# Patient Record
Sex: Female | Born: 1955 | Race: White | Hispanic: No | Smoking: Former smoker
Health system: Southern US, Community
[De-identification: ages and names within clinical notes are randomized; demographics above are authoritative.]

## PROBLEM LIST (undated history)

## (undated) DIAGNOSIS — E785 Hyperlipidemia, unspecified: Secondary | ICD-10-CM

## (undated) DIAGNOSIS — K219 Gastro-esophageal reflux disease without esophagitis: Secondary | ICD-10-CM

## (undated) HISTORY — DX: Gastro-esophageal reflux disease without esophagitis: K21.9

## (undated) HISTORY — PX: PELVIC LAPAROSCOPY: SHX162

## (undated) HISTORY — PX: LASER LAPAROSCOPY: SHX1952

## (undated) HISTORY — PX: TONSILLECTOMY: SUR1361

## (undated) HISTORY — DX: Hyperlipidemia, unspecified: E78.5

---

## 2006-05-22 HISTORY — PX: SKIN CANCER DESTRUCTION: SHX778

## 2010-05-22 HISTORY — PX: COLONOSCOPY: SHX174

## 2015-04-16 LAB — HM MAMMOGRAPHY

## 2015-05-03 LAB — HM PAP SMEAR
HM PAP: NEGATIVE
HM PAP: NEGATIVE

## 2016-06-20 ENCOUNTER — Encounter: Payer: Self-pay | Admitting: Osteopathic Medicine

## 2016-06-20 ENCOUNTER — Ambulatory Visit (INDEPENDENT_AMBULATORY_CARE_PROVIDER_SITE_OTHER): Payer: PRIVATE HEALTH INSURANCE | Admitting: Osteopathic Medicine

## 2016-06-20 VITALS — BP 138/84 | HR 91 | Ht 60.0 in | Wt 166.0 lb

## 2016-06-20 DIAGNOSIS — K219 Gastro-esophageal reflux disease without esophagitis: Secondary | ICD-10-CM | POA: Insufficient documentation

## 2016-06-20 DIAGNOSIS — K580 Irritable bowel syndrome with diarrhea: Secondary | ICD-10-CM | POA: Diagnosis not present

## 2016-06-20 DIAGNOSIS — E785 Hyperlipidemia, unspecified: Secondary | ICD-10-CM | POA: Insufficient documentation

## 2016-06-20 LAB — COMPLETE METABOLIC PANEL WITH GFR
ALBUMIN: 3.8 g/dL (ref 3.6–5.1)
ALK PHOS: 91 U/L (ref 33–130)
ALT: 17 U/L (ref 6–29)
AST: 17 U/L (ref 10–35)
BILIRUBIN TOTAL: 0.3 mg/dL (ref 0.2–1.2)
BUN: 12 mg/dL (ref 7–25)
CO2: 25 mmol/L (ref 20–31)
CREATININE: 0.82 mg/dL (ref 0.50–1.05)
Calcium: 9.6 mg/dL (ref 8.6–10.4)
Chloride: 105 mmol/L (ref 98–110)
GFR, EST NON AFRICAN AMERICAN: 79 mL/min (ref >=60)
GFR, Est African American: >89 mL/min (ref >=60)
GLUCOSE: 96 mg/dL (ref 65–99)
Potassium: 4.6 mmol/L (ref 3.5–5.3)
SODIUM: 141 mmol/L (ref 135–146)
TOTAL PROTEIN: 6.5 g/dL (ref 6.1–8.1)

## 2016-06-20 LAB — CBC WITH DIFFERENTIAL/PLATELET
BASOS ABS: 0 {cells}/uL (ref 0–200)
BASOS PCT: 0 %
EOS PCT: 3 %
Eosinophils Absolute: 300 cells/uL (ref 15–500)
HCT: 38.6 % (ref 35.0–45.0)
HEMOGLOBIN: 12.9 g/dL (ref 11.7–15.5)
LYMPHS ABS: 3000 {cells}/uL (ref 850–3900)
Lymphocytes Relative: 30 %
MCH: 31.9 pg (ref 27.0–33.0)
MCHC: 33.4 g/dL (ref 32.0–36.0)
MCV: 95.3 fL (ref 80.0–100.0)
MPV: 10.1 fL (ref 7.5–12.5)
Monocytes Absolute: 600 cells/uL (ref 200–950)
Monocytes Relative: 6 %
NEUTROS ABS: 6100 {cells}/uL (ref 1500–7800)
Neutrophils Relative %: 61 %
Platelets: 427 10*3/uL — ABNORMAL HIGH (ref 140–400)
RBC: 4.05 MIL/uL (ref 3.80–5.10)
RDW: 13.1 % (ref 11.0–15.0)
WBC: 10 10*3/uL (ref 3.8–10.8)

## 2016-06-20 LAB — LIPID PANEL
Cholesterol: 179 mg/dL (ref <200)
HDL: 50 mg/dL — ABNORMAL LOW (ref >50)
LDL CALC: 90 mg/dL (ref <100)
Total CHOL/HDL Ratio: 3.6 Ratio (ref <5.0)
Triglycerides: 196 mg/dL — ABNORMAL HIGH (ref <150)
VLDL: 39 mg/dL — ABNORMAL HIGH (ref <30)

## 2016-06-20 LAB — TSH: TSH: 2.19 mIU/L

## 2016-06-20 MED ORDER — LOPERAMIDE HCL 2 MG PO TABS: 1.0000 mg | ORAL_TABLET | Freq: Three times a day (TID) | ORAL | 1 refills | Status: DC

## 2016-06-20 MED ORDER — ONDANSETRON HCL 8 MG PO TABS: 4.0000 mg | ORAL_TABLET | Freq: Three times a day (TID) | ORAL | 1 refills | Status: DC

## 2016-06-20 NOTE — Patient Instructions (Addendum)
Call if no better after 1-2 weeks w/ Rx and dietary modifications.  Seek medical care if severe pain, fever, bloody stool, or other concerns See below for Low FODMAP diet      2017 UpToDate Characteristics and sources of common FODMAPs  Word that corresponds to letter in acronym Compounds in this category Foods that contain these compounds  F Fermentable  O Oligosaccharides Fructans, galacto-oligosaccharides Wheat, barley, rye, onion, leek, white part of spring onion, garlic, shallots, artichokes, beetroot, fennel, peas, chicory, pistachio, cashews, legumes, lentils, and chickpeas  D Disaccharides Lactose Milk, custard, ice cream, and yogurt  M Monosaccharides "Free fructose" (fructose in excess of glucose) Apples, pears, mangoes, cherries, watermelon, asparagus, sugar snap peas, honey, high-fructose corn syrup  A And  P Polyols Sorbitol, mannitol, maltitol, and xylitol Apples, pears, apricots, cherries, nectarines, peaches, plums, watermelon, mushrooms, cauliflower, artificially sweetened chewing gum and confectionery  FODMAPs: fermentable oligosaccharides, disaccharides, monosaccharides, and polyols. Adapted by permission from [employer]: Limited Brands[employer]an Journal of Gastroenterology. Lonell FaceShepherd SJ, Lomer MC, [employer] [place]. Short-chain carbohydrates and functional gastrointestinal disorders. Am J Gastroenterol 2013; 108:707. Copyright  2013. www.nature.com/ajg. Graphic 6213090186 Version 2.0    Irritable Bowel Syndrome, Adult Irritable bowel syndrome (IBS) is not one specific disease. It is a group of symptoms that affects the organs responsible for digestion (gastrointestinal or GI tract). To regulate how your GI tract works, your body sends signals back and forth between your intestines and your brain. If you have IBS, there may be a problem with these signals. As a result, your GI tract does not function normally. Your intestines may become more sensitive and overreact to certain things.  This is especially true when you eat certain foods or when you are under stress. There are four types of IBS. These may be determined based on the consistency of your stool:  IBS with diarrhea.  IBS with constipation.  Mixed IBS.  Unsubtyped IBS. It is important to know which type of IBS you have. Some treatments are more likely to be helpful for certain types of IBS. What are the causes? The exact cause of IBS is not known. What increases the risk? You may have a higher risk of IBS if:  You are a woman.  You are younger than 60 years old.  You have a family history of IBS.  You have mental health problems.  You have had bacterial infection of your GI tract. What are the signs or symptoms? Symptoms of IBS vary from person to person. The main symptom is abdominal pain or discomfort. Additional symptoms usually include one or more of the following:  Diarrhea, constipation, or both.  Abdominal swelling or bloating.  Feeling full or sick after eating a small or regular-size meal.  Frequent gas.  Mucus in the stool.  A feeling of having more stool left after a bowel movement. Symptoms tend to come and go. They may be associated with stress, psychiatric conditions, or nothing at all. How is this diagnosed? There is no specific test to diagnose IBS. Your health care provider will make a diagnosis based on a physical exam, medical history, and your symptoms. You may have other tests to rule out other conditions that may be causing your symptoms. These may include:  Blood tests.  X-rays.  CT scan.  Endoscopy and colonoscopy. This is a test in which your GI tract is viewed with a long, thin, flexible tube. How is this treated? There is no cure for IBS, but treatment can help relieve  symptoms. IBS treatment often includes:  Changes to your diet, such as:  Eating more fiber.  Avoiding foods that cause symptoms.  Drinking more water.  Eating regular, medium-sized  portioned meals.  Medicines. These may include:  Fiber supplements if you have constipation.  Medicine to control diarrhea (antidiarrheal medicines).  Medicine to help control muscle spasms in your GI tract (antispasmodic medicines).  Medicines to help with any mental health issues, such as antidepressants or tranquilizers.  Therapy.  Talk therapy may help with anxiety, depression, or other mental health issues that can make IBS symptoms worse.  Stress reduction.  Managing your stress can help keep symptoms under control. Follow these instructions at home:  Take medicines only as directed by your health care provider.  Eat a healthy diet.  Avoid foods and drinks with added sugar.  Include more whole grains, fruits, and vegetables gradually into your diet. This may be especially helpful if you have IBS with constipation.  Avoid any foods and drinks that make your symptoms worse. These may include dairy products and caffeinated or carbonated drinks.  Do not eat large meals.  Drink enough fluid to keep your urine clear or pale yellow.  Exercise regularly. Ask your health care provider for recommendations of good activities for you.  Keep all follow-up visits as directed by your health care provider. This is important. Contact a health care provider if:  You have constant pain.  You have trouble or pain with swallowing.  You have worsening diarrhea. Get help right away if:  You have severe and worsening abdominal pain.  You have diarrhea and:  You have a rash, stiff neck, or severe headache.  You are irritable, sleepy, or difficult to awaken.  You are weak, dizzy, or extremely thirsty.  You have bright red blood in your stool or you have black tarry stools.  You have unusual abdominal swelling that is painful.  You vomit continuously.  You vomit blood (hematemesis).  You have both abdominal pain and a fever. This information is not intended to replace  advice given to you by your health care provider. Make sure you discuss any questions you have with your health care provider. Document Released: 05/08/2005 Document Revised: 10/08/2015 Document Reviewed: 01/23/2014 Elsevier Interactive Patient Education  2017 [employer].

## 2016-06-20 NOTE — Progress Notes (Signed)
HPI: Dawn Dunlap is a 61 y.o. female  who presents to [facility] today, 06/20/16,  for chief complaint of:  Chief Complaint  Patient presents with  . Establish Care    UPSET STOMACH      Loose stool, average 8 times per day, somewhat formed, was watery. Tried Lomotil. There has been no blood in the stool except int he beginning when was bad, thinks hemorrhoids. No fever/chills,. Pain occasionally lower abdomen, both sides, feels like cramping. Pain improves w/ defecation. Was diagnosed with irritable bowel. No heartburn, no nausea/vomiting. Lomotil tried. Eating bland food, small portions. No dizziness, no weakness.   Hx thyroid cysts/nodule, has had biopsy which was negative. Due for repeat thyroid level check.    Past medical, surgical, social and family history reviewed: Patient Active Problem List   Diagnosis Date Noted  . GERD (gastroesophageal reflux disease) 06/20/2016  . Hyperlipidemia 06/20/2016   Past Surgical History:  Procedure Laterality Date  . COLONOSCOPY  2012  . LASER LAPAROSCOPY     X2  . PELVIC LAPAROSCOPY  1991/1992  . 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 TONSILLECTOMY     Social History  Substance Use Topics  . Smoking status: Former Games developer  . Smokeless tobacco: Never Used  . Alcohol use Not on file   No family history on file.   Current medication list and allergy/intolerance information reviewed:   Current Outpatient Prescriptions  Medication Sig Dispense Refill  . ALPRAZolam (XANAX) 0.5 MG tablet Take 1/2 to 1 tablet at night as needed for anxiety    . buPROPion (WELLBUTRIN XL) 300 MG 24 hr tablet Take by mouth.    . lansoprazole (PREVACID) 30 MG capsule TK ONE C PO QD  2  . simvastatin (ZOCOR) 40 MG tablet TAKE ONE TABLET BY MOUTH AT BEDTIME     No current facility-administered medications for this visit.    Allergies  Allergen Reactions  . Kiwi Extract Itching    Throats itches      Review  of Systems:  Constitutional:  No  fever, no chills, No recent illness, No unintentional weight changes. No significant fatigue.   HEENT: No  headache, no vision change, no hearing change, No sore throat, No  sinus pressure  Cardiac: No  chest pain, No  pressure, No palpitations, No  Orthopnea  Respiratory:  No  shortness of breath. No  Cough  Gastrointestinal: No  abdominal pain, No  nausea, No  vomiting,  No  blood in stool, No  diarrhea, No  constipation   Musculoskeletal: +new myalgia/arthralgia  Genitourinary: No  incontinence, No  abnormal genital bleeding, No abnormal genital discharge  Skin: No  Rash, No other wounds/concerning lesions  Hem/Onc: No  easy bruising/bleeding, No  abnormal lymph node  Endocrine: No cold intolerance,  No heat intolerance. No polyuria/polydipsia/polyphagia   Neurologic: No  weakness, No  dizziness, No  slurred speech/focal weakness/facial droop  Psychiatric: +concerns with depression, +concerns with anxiety, +sleep problems  Exam:  BP 138/84   Pulse 91   Ht 5' (1.524 m)   Wt 166 lb (75.3 kg)   BMI 32.42 kg/m   Constitutional: VS see above. General Appearance: alert, well-developed, well-nourished, NAD  Eyes: Normal lids and conjunctive, non-icteric sclera  Ears, Nose, Mouth, Throat: MMM, Normal external inspection ears/nares/mouth/lips/gums.  Neck: No masses, trachea midline. No thyroid enlargement. No tenderness/mass appreciated. No lymphadenopathy  Respiratory: Normal respiratory effort. no wheeze, no rhonchi, no rales  Cardiovascular: S1/S2 normal, no murmur, no rub/gallop auscultated. RRR. No lower extremity edema. .  Gastrointestinal: Nontender, no masses. No hepatomegaly, no splenomegaly. No hernia appreciated. Bowel sounds normal. Rectal exam deferred.   Musculoskeletal: Gait normal. No clubbing/cyanosis of digits.   Neurological: Normal balance/coordination. No tremor. No cranial nerve deficit on limited exam.   Skin:  warm, dry, intact. No rash/ulcer.    Psychiatric: Normal judgment/insight. Normal mood and affect. Oriented x3.      ASSESSMENT/PLAN:   1 likely irritable bowel as opposed to infectious. Patient does not have insurance. Will let us know about GI referral since she is due at any rate for colonoscopy to follow up on polyps. Will trial medications as below, consider prescriptions based on results with dietary changes and generic medications  Irritable bowel syndrome with diarrhea - Plan: ondansetron (ZOFRAN) 8 MG tablet, loperamide (IMODIUM A-D) 2 MG tablet, CBC with Differential/Platelet, COMPLETE METABOLIC PANEL WITH GFR, TSH, Lipid panel    Patient Instructions   Call if no better after 1-2 weeks w/ Rx and dietary modifications.  Seek medical care if severe pain, fever, bloody stool, or other concerns See below for Low FODMAP diet      2017 UpToDate Characteristics and sources of common FODMAPs  Word that corresponds to letter in acronym Compounds in this category Foods that contain these compounds  F Fermentable  O Oligosaccharides Fructans, galacto-oligosaccharides Wheat, barley, rye, onion, leek, white part of spring onion, garlic, shallots, artichokes, beetroot, fennel, peas, chicory, pistachio, cashews, legumes, lentils, and chickpeas  D Disaccharides Lactose Milk, custard, ice cream, and yogurt  M Monosaccharides "Free fructose" (fructose in excess of glucose) Apples, pears, mangoes, cherries, watermelon, asparagus, sugar snap peas, honey, high-fructose corn syrup  A And  P Polyols Sorbitol, mannitol, maltitol, and xylitol Apples, pears, apricots, cherries, nectarines, peaches, plums, watermelon, mushrooms, cauliflower, artificially sweetened chewing gum and confectionery  FODMAPs: fermentable oligosaccharides, disaccharides, monosaccharides, and polyols. Adapted by permission from [employer]: Limited Brands of Gastroenterology. Lonell Face, Lomer MC, [place]. Short-chain carbohydrates and functional gastrointestinal disorders. Am J Gastroenterol 2013; 108:707. Copyright  2013. www.nature.com/ajg. Graphic 16109 Version 2.0    Irritable Bowel Syndrome, Adult Irritable bowel syndrome (IBS) is not one specific disease. It is a group of symptoms that affects the organs responsible for digestion (gastrointestinal or GI tract). To regulate how your GI tract works, your body sends signals back and forth between your intestines and your brain. If you have IBS, there may be a problem with these signals. As a result, your GI tract does not function normally. Your intestines may become more sensitive and overreact to certain things. This is especially true when you eat certain foods or when you are under stress. There are four types of IBS. These may be determined based on the consistency of your stool:  IBS with diarrhea.  IBS with constipation.  Mixed IBS.  Unsubtyped IBS. It is important to know which type of IBS you have. Some treatments are more likely to be helpful for certain types of IBS. What are the causes? The exact cause of IBS is not known. What increases the risk? You may have a higher risk of IBS if:  You are a woman.  You are younger than 60 years old.  You have a family history of IBS.  You have mental health problems.  You have had bacterial infection of your GI tract. What are the signs or symptoms? Symptoms of IBS vary from person to person.  The main symptom is abdominal pain or discomfort. Additional symptoms usually include one or more of the following:  Diarrhea, constipation, or both.  Abdominal swelling or bloating.  Feeling full or sick after eating a small or regular-size meal.  Frequent gas.  Mucus in the stool.  A feeling of having more stool left after a bowel movement. Symptoms tend to come and go. They may be associated with stress, psychiatric conditions, or nothing at all. How is this  diagnosed? There is no specific test to diagnose IBS. Your health care provider will make a diagnosis based on a physical exam, medical history, and your symptoms. You may have other tests to rule out other conditions that may be causing your symptoms. These may include:  Blood tests.  X-rays.  CT scan.  Endoscopy and colonoscopy. This is a test in which your GI tract is viewed with a long, thin, flexible tube. How is this treated? There is no cure for IBS, but treatment can help relieve symptoms. IBS treatment often includes:  Changes to your diet, such as:  Eating more fiber.  Avoiding foods that cause symptoms.  Drinking more water.  Eating regular, medium-sized portioned meals.  Medicines. These may include:  Fiber supplements if you have constipation.  Medicine to control diarrhea (antidiarrheal medicines).  Medicine to help control muscle spasms in your GI tract (antispasmodic medicines).  Medicines to help with any mental health issues, such as antidepressants or tranquilizers.  Therapy.  Talk therapy may help with anxiety, depression, or other mental health issues that can make IBS symptoms worse.  Stress reduction.  Managing your stress can help keep symptoms under control. Follow these instructions at home:  Take medicines only as directed by your health care provider.  Eat a healthy diet.  Avoid foods and drinks with added sugar.  Include more whole grains, fruits, and vegetables gradually into your diet. This may be especially helpful if you have IBS with constipation.  Avoid any foods and drinks that make your symptoms worse. These may include dairy products and caffeinated or carbonated drinks.  Do not eat large meals.  Drink enough fluid to keep your urine clear or pale yellow.  Exercise regularly. Ask your health care provider for recommendations of good activities for you.  Keep all follow-up visits as directed by your health care provider.  This is important. Contact a health care provider if:  You have constant pain.  You have trouble or pain with swallowing.  You have worsening diarrhea. Get help right away if:  You have severe and worsening abdominal pain.  You have diarrhea and:  You have a rash, stiff neck, or severe headache.  You are irritable, sleepy, or difficult to awaken.  You are weak, dizzy, or extremely thirsty.  You have bright red blood in your stool or you have black tarry stools.  You have unusual abdominal swelling that is painful.  You vomit continuously.  You vomit blood (hematemesis).  You have both abdominal pain and a fever. This information is not intended to replace advice given to you by your health care provider. Make sure you discuss any questions you have with your health care provider. Document Released: 05/08/2005 Document Revised: 10/08/2015 Document Reviewed: 01/23/2014 Elsevier Interactive Patient Education  2017 Elsevier Inc.     Visit summary with medication list and pertinent instructions was printed for patient to review. All questions at time of visit were answered - patient instructed to contact office with any additional concerns. ER/RTC  precautions were reviewed with the patient. Follow-up plan: Return if symptoms worsen or fail to improve.

## 2016-07-07 ENCOUNTER — Encounter: Payer: Self-pay | Admitting: Osteopathic Medicine

## 2016-07-11 LAB — HM COLONOSCOPY

## 2016-07-12 ENCOUNTER — Encounter: Payer: Self-pay | Admitting: Osteopathic Medicine

## 2016-07-12 ENCOUNTER — Telehealth: Payer: Self-pay | Admitting: Osteopathic Medicine

## 2016-07-12 MED ORDER — ALPRAZOLAM 0.5 MG PO TABS: 0.2500 mg | ORAL_TABLET | Freq: Every evening | ORAL | 0 refills | Status: DC | PRN

## 2016-07-12 NOTE — Telephone Encounter (Signed)
Patient sent my chart message asking for refill alprazolam.  Last I saw patient we spent the visit discussing acute GI issues/irritable bowel syndrome. We did not discuss anxiety/insomnia history.   Please call patient: I'm okay to refill 30 tablets, but additional refills will require further discussion with me in the office as we did not discuss this in detail at her previous visit and I typically avoid using this prescription long-term for sleep/anxiety      [place] database reviewed:  Date Dispensed/ Date Prescribed Drug Name/ NDC Qty. Dispensed/ Days Supply Refill #/ Authorized Refills GafferX # Prescriber Dispenser Recipient *Pmt. Method **MED Daily 02/29/2016 02/21/2016 ALPRAZOLAM 0.5 MG TABLET [id number] 30 30 0 0 [id number] [place] [id number] [facility] [address] [place] Dunlap, Dawn [birth year] [address] [place], [place] [id number] 04 0 08/22/2015 06/25/2015 ALPRAZOLAM 1 MG TABLET [id number] 90 90 0 0 [id number] ZAGAR CHRISTOPHER A MD [place] [id number] [place], [place] Dunlap, Dawn [birth year] [address] [place], [place] [id number]

## 2016-07-14 NOTE — Telephone Encounter (Signed)
Patient has been informed. And she verbally understood that appointment is needed for further refills.Rhonda Cunningham,CMA

## 2016-07-30 ENCOUNTER — Encounter: Payer: Self-pay | Admitting: Osteopathic Medicine

## 2016-09-01 ENCOUNTER — Encounter: Payer: Self-pay | Admitting: Osteopathic Medicine

## 2016-10-10 ENCOUNTER — Encounter: Payer: Self-pay | Admitting: Osteopathic Medicine

## 2016-10-10 DIAGNOSIS — K635 Polyp of colon: Secondary | ICD-10-CM | POA: Insufficient documentation

## 2016-11-12 ENCOUNTER — Encounter: Payer: Self-pay | Admitting: Osteopathic Medicine

## 2017-02-27 ENCOUNTER — Encounter: Payer: Self-pay | Admitting: Osteopathic Medicine

## 2017-02-27 MED ORDER — BUPROPION HCL ER (XL) 300 MG PO TB24: 300.0000 mg | ORAL_TABLET | Freq: Every day | ORAL | 0 refills | Status: DC

## 2017-02-27 MED ORDER — SIMVASTATIN 40 MG PO TABS: 40.0000 mg | ORAL_TABLET | Freq: Every day | ORAL | 0 refills | Status: DC

## 2017-02-27 MED ORDER — LANSOPRAZOLE 30 MG PO CPDR: DELAYED_RELEASE_CAPSULE | ORAL | 0 refills | Status: DC

## 2017-03-14 ENCOUNTER — Other Ambulatory Visit: Payer: Self-pay | Admitting: Osteopathic Medicine

## 2017-03-14 DIAGNOSIS — Z1239 Encounter for other screening for malignant neoplasm of breast: Secondary | ICD-10-CM

## 2017-03-15 ENCOUNTER — Ambulatory Visit (INDEPENDENT_AMBULATORY_CARE_PROVIDER_SITE_OTHER): Payer: Self-pay

## 2017-03-15 DIAGNOSIS — Z1239 Encounter for other screening for malignant neoplasm of breast: Secondary | ICD-10-CM

## 2017-03-15 DIAGNOSIS — Z1231 Encounter for screening mammogram for malignant neoplasm of breast: Secondary | ICD-10-CM

## 2017-06-18 ENCOUNTER — Ambulatory Visit (INDEPENDENT_AMBULATORY_CARE_PROVIDER_SITE_OTHER): Payer: Self-pay | Admitting: Osteopathic Medicine

## 2017-06-18 ENCOUNTER — Encounter: Payer: Self-pay | Admitting: Osteopathic Medicine

## 2017-06-18 VITALS — BP 139/80 | HR 80 | Temp 98.1°F | Ht 60.0 in | Wt 165.0 lb

## 2017-06-18 DIAGNOSIS — Z0000 Encounter for general adult medical examination without abnormal findings: Secondary | ICD-10-CM

## 2017-06-18 DIAGNOSIS — M8580 Other specified disorders of bone density and structure, unspecified site: Secondary | ICD-10-CM

## 2017-06-18 DIAGNOSIS — L989 Disorder of the skin and subcutaneous tissue, unspecified: Secondary | ICD-10-CM

## 2017-06-18 DIAGNOSIS — Z8639 Personal history of other endocrine, nutritional and metabolic disease: Secondary | ICD-10-CM

## 2017-06-18 DIAGNOSIS — F419 Anxiety disorder, unspecified: Secondary | ICD-10-CM

## 2017-06-18 DIAGNOSIS — K580 Irritable bowel syndrome with diarrhea: Secondary | ICD-10-CM

## 2017-06-18 DIAGNOSIS — E7849 Other hyperlipidemia: Secondary | ICD-10-CM

## 2017-06-18 DIAGNOSIS — K219 Gastro-esophageal reflux disease without esophagitis: Secondary | ICD-10-CM

## 2017-06-18 MED ORDER — ALPRAZOLAM 0.5 MG PO TABS: 0.2500 mg | ORAL_TABLET | Freq: Every evening | ORAL | 0 refills | Status: DC | PRN

## 2017-06-18 MED ORDER — LOPERAMIDE HCL 2 MG PO TABS: 1.0000 mg | ORAL_TABLET | Freq: Three times a day (TID) | ORAL | 1 refills | Status: DC

## 2017-06-18 MED ORDER — BUPROPION HCL ER (XL) 150 MG PO TB24: 150.0000 mg | ORAL_TABLET | Freq: Every day | ORAL | 1 refills | Status: DC

## 2017-06-18 MED ORDER — SIMVASTATIN 40 MG PO TABS: 20.0000 mg | ORAL_TABLET | Freq: Every day | ORAL | 1 refills | Status: DC

## 2017-06-18 MED ORDER — RANITIDINE HCL 300 MG PO TABS: 300.0000 mg | ORAL_TABLET | Freq: Every day | ORAL | 3 refills | Status: DC

## 2017-06-18 NOTE — Progress Notes (Signed)
HPI: Dawn Dunlap is a 62 y.o. female who  has a past medical history of GERD (gastroesophageal reflux disease) and Hyperlipidemia.  she presents to [facility] today, 06/18/17,  for chief complaint of: Annual Physical     Patient here for annual physical / wellness exam.  See preventive care reviewed as below.  Recent labs reviewed in detail with the patient.   Additional concerns today include:   None major, needs refills  WOuld like to come down on Wellbutrin  PPI helping GERD  Quick skin check - ripped off brownish lesion from her back which bled a bit last week, seeing Derm upcoming     Past medical, surgical, social and family history reviewed:  Patient Active Problem List   Diagnosis Date Noted  . Hyperplastic polyp of sigmoid colon 10/10/2016  . GERD (gastroesophageal reflux disease) 06/20/2016  . Hyperlipidemia 06/20/2016  . Irritable bowel syndrome with diarrhea 06/20/2016      Social History   Tobacco Use  . Smoking status: Former Smoker    Types: E-cigarettes    Last attempt to quit: 05/22/1990    Years since quitting: 27.0  . Smokeless tobacco: Never Used  Substance Use Topics  . Alcohol use: Yes    Comment: social/occasional     No family history on file.   Current medication list and allergy/intolerance information reviewed:    Current Outpatient Medications  Medication Sig Dispense Refill  . ALPRAZolam (XANAX) 0.5 MG tablet Take 0.5-1 tablets (0.25-0.5 mg total) by mouth at bedtime as needed for anxiety (Use sparingly to prevent tolerance/dependence). 30 tablet 0  . buPROPion (WELLBUTRIN XL) 300 MG 24 hr tablet Take 1 tablet (300 mg total) by mouth daily. 90 tablet 0  . lansoprazole (PREVACID) 30 MG capsule TK ONE C PO QD 90 capsule 0  . loperamide (IMODIUM A-D) 2 MG tablet Take 0.5-1 tablets (1-2 mg total) by mouth 4 (four) times daily -  before meals and at bedtime. 90 tablet 1  . ondansetron (ZOFRAN) 8  MG tablet Take 0.5-1 tablets (4-8 mg total) by mouth 4 (four) times daily -  before meals and at bedtime. Mex 3 pills per day 90 tablet 1  . simvastatin (ZOCOR) 40 MG tablet Take 1 tablet (40 mg total) by mouth at bedtime. 90 tablet 0   No current facility-administered medications for this visit.     Allergies  Allergen Reactions  . Kiwi Extract Itching    Throats itches      Review of Systems:  Constitutional:  No  fever, no chills, No recent illness, No unintentional weight changes. No significant fatigue.   HEENT: No  headache, no vision change, no hearing change, No sore throat, No  sinus pressure  Cardiac: No  chest pain, No  pressure, No palpitations, No  Orthopnea  Respiratory:  No  shortness of breath. No  Cough  Gastrointestinal: No  abdominal pain, No  nausea, No  vomiting,  No  blood in stool, No  diarrhea, No  constipation   Musculoskeletal: No new myalgia/arthralgia  Skin: No  Rash, No other wounds/concerning lesions  Genitourinary: No  incontinence, No  abnormal genital bleeding, No abnormal genital discharge  Hem/Onc: No  easy bruising/bleeding, No  abnormal lymph node  Endocrine: No cold intolerance,  No heat intolerance. No polyuria/polydipsia/polyphagia   Neurologic: No  weakness, No  dizziness, No  slurred speech/focal weakness/facial droop  Psychiatric: No  concerns with depression, No  concerns with anxiety, No  sleep problems, No mood problems  Exam:  BP 140/78   Pulse 85   Temp 98.1 F (36.7 C) (Oral)   Ht 5' (1.524 m)   Wt 165 lb 0.6 oz (74.9 kg)   BMI 32.23 kg/m    Constitutional: VS see above. General Appearance: alert, well-developed, well-nourished, NAD  Eyes: Normal lids and conjunctive, non-icteric sclera  Ears, Nose, Mouth, Throat: MMM, Normal external inspection ears/nares/mouth/lips/gums. TM normal bilaterally. Pharynx/tonsils no erythema, no exudate. Nasal mucosa normal.   Neck: No masses, trachea midline. No thyroid enlargement.  No tenderness/mass appreciated. No lymphadenopathy  Respiratory: Normal respiratory effort. no wheeze, no rhonchi, no rales  Cardiovascular: S1/S2 normal, no murmur, no rub/gallop auscultated. RRR. No lower extremity edema. Pedal pulse II/IV bilaterally DP and PT. No carotid bruit or JVD. No abdominal aortic bruit.  Gastrointestinal: Nontender, no masses. No hepatomegaly, no splenomegaly. No hernia appreciated. Bowel sounds normal. Rectal exam deferred.   Musculoskeletal: Gait normal. No clubbing/cyanosis of digits.   Neurological: Normal balance/coordination. No tremor.  Skin: warm, dry, intact. No rash/ulcer. No concerning nevi or subq nodules on limited exam. Area on back pink w/ actinic change in the center, few seborrheic keratoses   Psychiatric: Normal judgment/insight. Normal mood and affect. Oriented x3.     ASSESSMENT/PLAN:   Annual physical exam - Plan: CBC, COMPLETE METABOLIC PANEL WITH GFR, Lipid panel, TSH, VITAMIN D 25 Hydroxy (Vit-D Deficiency, Fractures)  Osteopenia, unspecified location - Plan: VITAMIN D 25 Hydroxy (Vit-D Deficiency, Fractures), DG Bone Density  Other hyperlipidemia - Plan: Lipid panel, simvastatin (ZOCOR) 40 MG tablet  Gastroesophageal reflux disease, esophagitis presence not specified - Try H2B instead  - Plan: ranitidine (ZANTAC) 300 MG tablet  History of thyroid cyst - Plan: TSH  Irritable bowel syndrome with diarrhea - Plan: loperamide (IMODIUM A-D) 2 MG tablet, DISCONTINUED: loperamide (IMODIUM A-D) 2 MG tablet  Anxiety - Will try reduced dose Welbutrin and come off sometime next month or two. If ok can stay off. If want to continue, let me know  - Plan: ALPRAZolam (XANAX) 0.5 MG tablet, buPROPion (WELLBUTRIN XL) 150 MG 24 hr tablet  Abnormality, skin - actinic change vs ripped-off SebK, has derm appt     FEMALE PREVENTIVE CARE Updated 06/18/17   ANNUAL SCREENING/COUNSELING  Diet/Exercise - HEALTHY HABITS DISCUSSED TO DECREASE CV  RISK Social History   Tobacco Use  Smoking Status Former Smoker  . Types: E-cigarettes  . Last attempt to quit: 05/22/1990  . Years since quitting: 27.0  Smokeless Tobacco Never Used   Social History   Substance and Sexual Activity  Alcohol Use Yes   Comment: social/occasional    Dep[facility] 06/18/2017  Decreased Interest 0  Down, Depressed, Hopeless 0  PHQ - 2 Score 0  Altered sleeping 3  Tired, decreased energy 0  Change in appetite 0  Feeling bad or failure about yourself  0  Trouble concentrating 0  Moving slowly or fidgety/restless 0  Suicidal thoughts 0  PHQ-9 Score 3  Difficult doing work/chores Somewhat difficult   Domestic violence concerns - no  HTN SCREENING - SEE VITALS  SEXUAL HEALTH  Sexually active in the past year - No  Need/want STI testing today? - no  Concerns about libido or pain with sex? - no  Plans for pregnancy? - n/a postmenopausal  INFECTIOUS DISEASE SCREENING  HIV - needs - declined  GC/CT - does not need  HepC - DOB [birth year] - needs - declined  TB - does not  need  DISEASE SCREENING  Lipid - needs  DM2 - needs  Osteoporosis - women age 41+ - needs - (+)Hx osteopenia, last DEXA few years ago  CANCER SCREENING  Cervical - does not need  Breast - does not need  Lung - does not need  Colon - does not need  ADULT VACCINATION  Influenza - annual vaccine recommended  Td - booster every 10 years   Zoster - Shingrix recommended 50+  PCV13 - was not indicated  PPSV23 - was not indicated Immunization History  Administered Date(s) Administered  . Influenza, Seasonal, Injecte, Preservative Fre 02/17/2014, 04/19/2015  . Influenza-Unspecified 04/05/2016  . Pneumococcal Conjugate-13 04/19/2015  . Pneumococcal Polysaccharide-23 02/19/2014  . Tdap 08/24/2014    OTHER  Fall - exercise and Vit D age 41+ - does not need     Visit summary with medication list and pertinent instructions was printed for  patient to review. All questions at time of visit were answered - patient instructed to contact office with any additional concerns. ER/RTC precautions were reviewed with the patient.   Follow-up plan: Return in about 1 year (around 06/18/2018) for [facility] PHYSICAL, sooner if needed.    Please note: voice recognition software was used to produce this document, and typos may escape review. Please contact Dr. Lyn Hollingshead for any needed clarifications.

## 2017-08-20 LAB — LIPID PANEL
CHOL/HDL RATIO: 3.1 (calc) (ref <5.0)
Cholesterol: 175 mg/dL (ref <200)
HDL: 56 mg/dL (ref >50)
LDL Cholesterol (Calc): 96 mg/dL (calc)
NON-HDL CHOLESTEROL (CALC): 119 mg/dL (ref <130)
TRIGLYCERIDES: 131 mg/dL (ref <150)

## 2017-08-20 LAB — COMPLETE METABOLIC PANEL WITH GFR
AG Ratio: 1.7 (calc) (ref 1.0–2.5)
ALT: 14 U/L (ref 6–29)
AST: 14 U/L (ref 10–35)
Albumin: 4.5 g/dL (ref 3.6–5.1)
Alkaline phosphatase (APISO): 92 U/L (ref 33–130)
BUN: 17 mg/dL (ref 7–25)
CALCIUM: 9.5 mg/dL (ref 8.6–10.4)
CO2: 26 mmol/L (ref 20–32)
CREATININE: 0.81 mg/dL (ref 0.50–0.99)
Chloride: 104 mmol/L (ref 98–110)
GFR, EST NON AFRICAN AMERICAN: 79 mL/min/{1.73_m2} (ref >=60)
GFR, Est African American: 91 mL/min/{1.73_m2} (ref >=60)
GLOBULIN: 2.6 g/dL (ref 1.9–3.7)
Glucose, Bld: 86 mg/dL (ref 65–99)
Potassium: 4.7 mmol/L (ref 3.5–5.3)
Sodium: 139 mmol/L (ref 135–146)
Total Bilirubin: 0.3 mg/dL (ref 0.2–1.2)
Total Protein: 7.1 g/dL (ref 6.1–8.1)

## 2017-08-20 LAB — CBC
HEMATOCRIT: 39.6 % (ref 35.0–45.0)
Hemoglobin: 13.8 g/dL (ref 11.7–15.5)
MCH: 32.5 pg (ref 27.0–33.0)
MCHC: 34.8 g/dL (ref 32.0–36.0)
MCV: 93.2 fL (ref 80.0–100.0)
MPV: 10.3 fL (ref 7.5–12.5)
PLATELETS: 414 10*3/uL — AB (ref 140–400)
RBC: 4.25 10*6/uL (ref 3.80–5.10)
RDW: 12.4 % (ref 11.0–15.0)
WBC: 7.5 10*3/uL (ref 3.8–10.8)

## 2017-08-20 LAB — TSH: TSH: 2.48 mIU/L (ref 0.40–4.50)

## 2017-12-07 ENCOUNTER — Other Ambulatory Visit: Payer: Self-pay | Admitting: Osteopathic Medicine

## 2017-12-07 DIAGNOSIS — F419 Anxiety disorder, unspecified: Secondary | ICD-10-CM

## 2018-03-27 ENCOUNTER — Encounter: Payer: Self-pay | Admitting: Osteopathic Medicine

## 2018-03-27 ENCOUNTER — Ambulatory Visit (INDEPENDENT_AMBULATORY_CARE_PROVIDER_SITE_OTHER): Payer: Self-pay

## 2018-03-27 ENCOUNTER — Ambulatory Visit (INDEPENDENT_AMBULATORY_CARE_PROVIDER_SITE_OTHER): Payer: Self-pay | Admitting: Osteopathic Medicine

## 2018-03-27 ENCOUNTER — Other Ambulatory Visit: Payer: Self-pay | Admitting: Osteopathic Medicine

## 2018-03-27 DIAGNOSIS — K219 Gastro-esophageal reflux disease without esophagitis: Secondary | ICD-10-CM

## 2018-03-27 DIAGNOSIS — Z124 Encounter for screening for malignant neoplasm of cervix: Secondary | ICD-10-CM

## 2018-03-27 DIAGNOSIS — Z113 Encounter for screening for infections with a predominantly sexual mode of transmission: Secondary | ICD-10-CM

## 2018-03-27 DIAGNOSIS — B373 Candidiasis of vulva and vagina: Secondary | ICD-10-CM

## 2018-03-27 DIAGNOSIS — R21 Rash and other nonspecific skin eruption: Secondary | ICD-10-CM

## 2018-03-27 DIAGNOSIS — Z1231 Encounter for screening mammogram for malignant neoplasm of breast: Secondary | ICD-10-CM

## 2018-03-27 DIAGNOSIS — B3731 Acute candidiasis of vulva and vagina: Secondary | ICD-10-CM

## 2018-03-27 MED ORDER — FLUCONAZOLE 150 MG PO TABS: 150.0000 mg | ORAL_TABLET | Freq: Once | ORAL | 1 refills | Status: AC

## 2018-03-27 MED ORDER — VALACYCLOVIR HCL 1 G PO TABS: 1000.0000 mg | ORAL_TABLET | Freq: Two times a day (BID) | ORAL | 0 refills | Status: DC

## 2018-03-27 MED ORDER — OMEPRAZOLE 20 MG PO CPDR: 20.0000 mg | DELAYED_RELEASE_CAPSULE | Freq: Every day | ORAL | 3 refills | Status: DC

## 2018-03-27 NOTE — Patient Instructions (Signed)
Plan:  With the blistering rash, I am concerned about genital HSV outbreak, mild, but we will go ahead and start antivirals ASAP.  Continue these for 7 to 10 days, if rash gets worse despite medicines or does not go away after 7 to 10 days, please let me know.  With the vaginal irritation, I am concerned about vaginal yeast infection, may also be due to postmenopausal skin changes.  Will send prescription for yeast infection.  Can stop the ranitidine, I sent a prescription for omeprazole for acid reflux.

## 2018-03-27 NOTE — Progress Notes (Signed)
HPI: Dawn Dunlap is a 62 y.o. female who  has a past medical history of GERD (gastroesophageal reflux disease) and Hyperlipidemia.  she presents to [facility] today, 03/27/18,  for chief complaint of:  Acute visit - skin concern  Skin concern - bumps on her bottom, right perineal area.  Concerned about staph after speaking to a family member who is a Engineer, civil (consulting).  He also has a history of genital HSV.  Rash has been ongoing for about 2 days.  Described as irritated, burning.  Also reports redness/irritation left vaginal mucosa, no blisters/rash  Patient also requests different prescription for GERD   Past medical history, surgical history, and family history reviewed.  Current medication list and allergy/intolerance information reviewed.   (See remainder of HPI, ROS, Phys Exam below)     ASSESSMENT/PLAN:   Blistering rash - concerning for HSV, will initiate antivirals - Plan: Herpes simplex virus culture  Vaginal candida - topical therapies tried already, wlil send oral Rx  Cervical cancer screening - Plan: Cytology - PAP  Routine screening for STI (sexually transmitted infection) - Plan: WET PREP FOR TRICH, YEAST, CLUE, C. trachomatis/N. gonorrhoeae RNA  Gastroesophageal reflux disease, esophagitis presence not specified   Meds ordered this encounter  Medications  . valACYclovir (VALTREX) 1000 MG tablet    Sig: Take 1 tablet (1,000 mg total) by mouth 2 (two) times daily. For 7-10 days    Dispense:  20 tablet    Refill:  0  . fluconazole (DIFLUCAN) 150 MG tablet    Sig: Take 1 tablet (150 mg total) by mouth once for 1 dose. Repeat dose 72 hours if yeast infection persists    Dispense:  2 tablet    Refill:  1  . omeprazole (PRILOSEC) 20 MG capsule    Sig: Take 1 capsule (20 mg total) by mouth daily.    Dispense:  90 capsule    Refill:  3    Patient Instructions  Plan:  With the blistering rash, I am concerned about genital HSV  outbreak, mild, but we will go ahead and start antivirals ASAP.  Continue these for 7 to 10 days, if rash gets worse despite medicines or does not go away after 7 to 10 days, please let me know.  With the vaginal irritation, I am concerned about vaginal yeast infection, may also be due to postmenopausal skin changes.  Will send prescription for yeast infection.  Can stop the ranitidine, I sent a prescription for omeprazole for acid reflux.   Follow-up plan: Return if symptoms worsen or fail to improve.                      ############################################ ############################################ ############################################ ############################################    Outpatient Encounter Medications as of 03/27/2018  Medication Sig  . ALPRAZolam (XANAX) 0.5 MG tablet Take 0.5-1 tablets (0.25-0.5 mg total) by mouth at bedtime as needed for anxiety (Use sparingly to prevent tolerance/dependence).  [name] buPROPion (WELLBUTRIN XL) 150 MG 24 hr tablet TAKE 1 TABLET BY MOUTH ONCE DAILY  . loperamide (IMODIUM A-D) 2 MG tablet Take 0.5-1 tablets (1-2 mg total) by mouth 4 (four) times daily -  before meals and at bedtime.  . ranitidine (ZANTAC) 300 MG tablet Take 1 tablet (300 mg total) by mouth at bedtime.  . simvastatin (ZOCOR) 40 MG tablet Take 0.5 tablets (20 mg total) by mouth at bedtime.   No facility-administered encounter medications on file as of 03/27/2018.  Allergies  Allergen Reactions  . Kiwi Extract Itching    Throats itches      Review of Systems:  Constitutional: No recent illness  HEENT: No  headache, no vision change  Cardiac: No  chest pain, No  pressure, No palpitations  Respiratory:  No  shortness of breath. No  Cough  Gastrointestinal: No  abdominal pain  Musculoskeletal: No new myalgia/arthralgia  Skin: +Rash  Hem/Onc: No  easy bruising/bleeding, No  abnormal lumps/bumps  Neurologic: No  weakness, No   Dizziness   Exam:  BP (!) 153/80   Pulse 100   Temp 98.1 F (36.7 C) (Oral)   Ht 5' (1.524 m)   Wt 166 lb 11.2 oz (75.6 kg)   BMI 32.56 kg/m   Constitutional: VS see above. General Appearance: alert, well-developed, well-nourished, NAD  Eyes: Normal lids and conjunctive, non-icteric sclera  Ears, Nose, Mouth, Throat: MMM, Normal external inspection ears/nares/mouth/lips/gums.  Neck: No masses, trachea midline.   Respiratory: Normal respiratory effort.  Musculoskeletal: Gait normal. Symmetric and independent movement of all extremities  Neurological: Normal balance/coordination. No tremor.  Skin: warm, dry, intact.   Psychiatric: Normal judgment/insight. Normal mood and affect. Oriented x3.  GYN: normal urethra, atrophic vaginal mucosa, physiologic discharge, cervix normal without lesions, uterus not enlarged or tender, adnexa no masses and nontender.  Mild superficial blistering rash on right perineum, mild redness without ulceration at left vaginal introitus   Visit summary with medication list and pertinent instructions was printed for patient to review, advised to alert [place] if any changes needed. All questions at time of visit were answered - patient instructed to contact office with any additional concerns. ER/RTC precautions were reviewed with the patient and understanding verbalized.   Follow-up plan: Return if symptoms worsen or fail to improve.    Please note: voice recognition software was used to produce this document, and typos may escape review. Please contact Dr. Lyn Hollingshead for any needed clarifications.

## 2018-03-28 LAB — WET PREP FOR TRICH, YEAST, CLUE
MICRO NUMBER:: 91336282
Specimen Quality: ADEQUATE

## 2018-03-28 LAB — CYTOLOGY - PAP
DIAGNOSIS: NEGATIVE
HPV (WINDOPATH): NOT DETECTED

## 2018-03-28 LAB — TIQ- AMBIGUOUS ORDER

## 2018-03-28 LAB — C. TRACHOMATIS/N. GONORRHOEAE RNA
C. trachomatis RNA, TMA: NOT DETECTED
N. gonorrhoeae RNA, TMA: NOT DETECTED

## 2018-03-29 LAB — HERPES SIMPLEX VIRUS CULTURE
MICRO NUMBER: 91338238
SPECIMEN QUALITY:: ADEQUATE

## 2018-05-02 ENCOUNTER — Encounter: Payer: Self-pay | Admitting: Osteopathic Medicine

## 2018-05-08 ENCOUNTER — Encounter: Payer: Self-pay | Admitting: Osteopathic Medicine

## 2018-05-08 ENCOUNTER — Ambulatory Visit (INDEPENDENT_AMBULATORY_CARE_PROVIDER_SITE_OTHER): Payer: Self-pay | Admitting: Osteopathic Medicine

## 2018-05-08 ENCOUNTER — Telehealth: Payer: Self-pay | Admitting: *Deleted

## 2018-05-08 VITALS — BP 142/80 | HR 88 | Temp 97.6°F | Wt 169.1 lb

## 2018-05-08 DIAGNOSIS — Z0000 Encounter for general adult medical examination without abnormal findings: Secondary | ICD-10-CM

## 2018-05-08 DIAGNOSIS — R002 Palpitations: Secondary | ICD-10-CM

## 2018-05-08 DIAGNOSIS — A6004 Herpesviral vulvovaginitis: Secondary | ICD-10-CM

## 2018-05-08 DIAGNOSIS — I493 Ventricular premature depolarization: Secondary | ICD-10-CM

## 2018-05-08 DIAGNOSIS — N898 Other specified noninflammatory disorders of vagina: Secondary | ICD-10-CM

## 2018-05-08 MED ORDER — ESTRADIOL 10 MCG VA TABS: ORAL_TABLET | VAGINAL | 3 refills | Status: DC

## 2018-05-08 NOTE — Patient Instructions (Signed)
Premature Ventricular Contraction    A premature ventricular contraction (PVC) is a common kind of irregular heartbeat (arrhythmia). These contractions are extra heartbeats that start in the ventricles of the heart and occur too early in the normal sequence. During the PVC, the heart's normal electrical pathway is not used, so the beat is shorter and less effective. In most cases, these contractions come and go and do not require treatment.  What are the causes?  Common causes of the condition include:   Smoking.   Drinking alcohol.   Certain medicines.   Some illegal drugs.   Stress.   Caffeine.  Certain medical conditions can also cause PVCs:   Heart failure.   Heart attack, or coronary artery disease.   Heart valve problems.   Changes in minerals in the blood (electrolytes).   Low blood oxygen levels or high carbon dioxide levels.  In many cases, the cause of this condition is not known.  What are the signs or symptoms?  The main symptom of this condition is fast or skipped heartbeats (palpitations). Other symptoms include:   Chest pain.   Shortness of breath.   Feeling tired.   Dizziness.   Difficulty exercising.  In some cases, there are no symptoms.  How is this diagnosed?  This condition may be diagnosed based on:   Your medical history.   A physical exam. During the exam, the health care provider will check for irregular heartbeats.   Tests, such as:  ? An ECG (electrocardiogram) to monitor the electrical activity of your heart.  ? Ambulatory cardiac monitor. This device records your heartbeats for 24 hours or more.  ? Stress tests to see how exercise affects your heart rhythm and blood supply.  ? Echocardiogram. This test uses sound waves (ultrasound) to produce an image of your heart.  ? Electrophysiology study (EPS). This test checks for electrical problems in your heart.  How is this treated?  Treatment for this condition depends on any underlying conditions, the type of PVCs that you  are having, and how much the symptoms are interfering with your daily life.  Possible treatments include:   Avoiding things that cause premature contractions (triggers). These include caffeine and alcohol.   Taking medicines if symptoms are severe or if the extra heartbeats are frequent.   Getting treatment for underlying conditions that cause PVCs.   Having an implantable cardioverter defibrillator (ICD), if you are at risk for a serious arrhythmia. The ICD is a small device that is inserted into your chest to monitor your heartbeat. When it senses an irregular heartbeat, it sends a shock to bring the heartbeat back to normal.   Having a procedure to destroy the portion of the heart tissue that sends out abnormal signals (catheter ablation).  In some cases, no treatment is required.  Follow these instructions at home:  Alcohol use     Do not drink alcohol if:  ? Your health care provider tells you not to drink.  ? You are pregnant, may be pregnant, or are planning to become pregnant.  ? Alcohol triggers your episodes.   If you drink alcohol, limit how much you have. You may drink:  ? 0-1 drink a day for women.  ? 0-2 drinks a day for men.   Be aware of how much alcohol is in your drink. In the [place] one drink equals one typical bottle of beer (12 oz), one-half glass of wine (5 oz), or one shot of hard liquor (1   oz).  General instructions   Do not use any products that contain nicotine or tobacco, such as cigarettes and e-cigarettes. If you need help quitting, ask your health care provider.   Find healthy ways to manage stress. Avoid stressful situations when possible.   Exercise regularly. Ask your health care provider what type of exercise is safe for you.   Try to get at least 7-9 hours of sleep each night, or as much as recommended by your health care provider.   If caffeine triggers episodes of PVC, do not eat, drink, or use anything with caffeine in it.   Do not use illegal drugs.   Take  over-the-counter and prescription medicines only as told by your health care provider.   Keep all follow-up visits as told by your health care provider. This is important.  Contact a health care provider if you:   Feel palpitations.  Get help right away if you:   Have chest pain.   Have shortness of breath.   Have sweating for no reason.   Have nausea and vomiting.   Become light-headed or you faint.  Summary   A premature ventricular contraction (PVC) is a common kind of irregular heartbeat (arrhythmia).   In most cases, these contractions come and go and do not require treatment.   You may need to wear an ambulatory cardiac monitor. This records your heartbeats for 24 hours or more.   Treatment depends on any underlying conditions, the type of PVCs that you are having, and how much the symptoms are interfering with your daily life.  This information is not intended to replace advice given to you by your health care provider. Make sure you discuss any questions you have with your health care provider.  Document Released: 12/24/2003 Document Revised: 12/21/2017 Document Reviewed: 06/26/2017  Elsevier Interactive Patient Education  2019 [employer]

## 2018-05-08 NOTE — Telephone Encounter (Signed)
Pt made an online appt in urgent care for heart palpations for 10:00am today. Spoke with pt she reports that the s/s are mild today, they were worse over the weekend. denies any other cardiac s/s. Advised her to come on into the clinic as a walk in now, do not wait until her 10:00am appt. She thought she was scheduling an appt with her PCP next door online. She is going to call there office to see if they can see her first if not she will come into the urgent. Pt verbalized understanding.

## 2018-05-08 NOTE — Progress Notes (Signed)
HPI: Dawn Dunlap is a 62 y.o. female who  has a past medical history of GERD (gastroesophageal reflux disease) and Hyperlipidemia.  she presents to [facility] today, 05/08/18,  for chief complaint of:  Palpitations Persistent vaginal itching   . Context: Daughter is in med school and listened to heart, didn't sound normal (and like it was stopping) asked her mom to come get checked.  . Location/Quality: reports chest/heart palpitations, feels like a "thump" on occasion.  . Duration: onset 5 days . Timing: intermittent, random, every few minutes   Vaginal issue:  (+)HSV culture at last visit, persistent itching and dryness, see A/P.      At today's visit... Past medical history, surgical history, and family history reviewed and updated as needed.  Current medication list and allergy/intolerance information reviewed and updated as needed. (See remainder of HPI, ROS, Phys Exam below)   No results found.  No results found for this or any previous visit (from the past 72 hour(s)).  EKG interpretation: Rate: 85 Rhythm: sinus One PVC noted  No ST/T changes concerning for acute ischemia/infarct  Previous EKG: no tracings available for comparison        ASSESSMENT/PLAN: The primary encounter diagnosis was PVC (premature ventricular contraction). 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   States insurance probably is not going to cover estrogen creams, will trial vaginal tablets.  Consider beta-blockers if PVCs are persistent/bothersome, will get labs to cover all her bases and patient was advised to do what she can to decrease stress, avoid caffeine   Orders Placed This Encounter  Procedures  . CBC  . COMPLETE METABOLIC PANEL WITH GFR  . TSH  . Magnesium  . Urinalysis, Routine w reflex microscopic  . Lipid  panel  . EKG 12-Lead      Meds ordered this encounter  Medications  . Estradiol 10 MCG TABS vaginal tablet    Sig: Place vaginally daily for one week then twice per week after that    Dispense:  30 tablet    Refill:  3    Patient Instructions  Premature Ventricular Contraction  A premature ventricular contraction (PVC) is a common kind of irregular heartbeat (arrhythmia). These contractions are extra heartbeats that start in the ventricles of the heart and occur too early in the normal sequence. During the PVC, the heart's normal electrical pathway is not used, so the beat is shorter and less effective. In most cases, these contractions come and go and do not require treatment. What are the causes? Common causes of the condition include:  Smoking.  Drinking alcohol.  Certain medicines.  Some illegal drugs.  Stress.  Caffeine. Certain medical conditions can also cause PVCs:  Heart failure.  Heart attack, or coronary artery disease.  Heart valve problems.  Changes in minerals in the blood (electrolytes).  Low blood oxygen levels or high carbon dioxide levels. In many cases, the cause of this condition is not known. What are the signs or symptoms? The main symptom of this condition is fast or skipped heartbeats (palpitations). Other symptoms include:  Chest pain.  Shortness of breath.  Feeling tired.  Dizziness.  Difficulty exercising. In some cases, there are no symptoms. How is this diagnosed? This condition may be diagnosed based on:  Your medical history.  A physical exam. During the exam, the health care provider will check for irregular  heartbeats.  Tests, such as: ? An ECG (electrocardiogram) to monitor the electrical activity of your heart. ? Ambulatory cardiac monitor. This device records your heartbeats for 24 hours or more. ? Stress tests to see how exercise affects your heart rhythm and blood supply. ? Echocardiogram. This test uses sound  waves (ultrasound) to produce an image of your heart. ? Electrophysiology study (EPS). This test checks for electrical problems in your heart. How is this treated? Treatment for this condition depends on any underlying conditions, the type of PVCs that you are having, and how much the symptoms are interfering with your daily life. Possible treatments include:  Avoiding things that cause premature contractions (triggers). These include caffeine and alcohol.  Taking medicines if symptoms are severe or if the extra heartbeats are frequent.  Getting treatment for underlying conditions that cause PVCs.  Having an implantable cardioverter defibrillator (ICD), if you are at risk for a serious arrhythmia. The ICD is a small device that is inserted into your chest to monitor your heartbeat. When it senses an irregular heartbeat, it sends a shock to bring the heartbeat back to normal.  Having a procedure to destroy the portion of the heart tissue that sends out abnormal signals (catheter ablation). In some cases, no treatment is required. Follow these instructions at home: Alcohol use   Do not drink alcohol if: ? Your health care provider tells you not to drink. ? You are pregnant, may be pregnant, or are planning to become pregnant. ? Alcohol triggers your episodes.  If you drink alcohol, limit how much you have. You may drink: ? 0-1 drink a day for women. ? 0-2 drinks a day for men.  Be aware of how much alcohol is in your drink. In the [place] one drink equals one typical bottle of beer (12 oz), one-half glass of wine (5 oz), or one shot of hard liquor (1 oz). General instructions  Do not use any products that contain nicotine or tobacco, such as cigarettes and e-cigarettes. If you need help quitting, ask your health care provider.  Find healthy ways to manage stress. Avoid stressful situations when possible.  Exercise regularly. Ask your health care provider what type of exercise is safe  for you.  Try to get at least 7-9 hours of sleep each night, or as much as recommended by your health care provider.  If caffeine triggers episodes of PVC, do not eat, drink, or use anything with caffeine in it.  Do not use illegal drugs.  Take over-the-counter and prescription medicines only as told by your health care provider.  Keep all follow-up visits as told by your health care provider. This is important. Contact a health care provider if you:  Feel palpitations. Get help right away if you:  Have chest pain.  Have shortness of breath.  Have sweating for no reason.  Have nausea and vomiting.  Become light-headed or you faint. Summary  A premature ventricular contraction (PVC) is a common kind of irregular heartbeat (arrhythmia).  In most cases, these contractions come and go and do not require treatment.  You may need to wear an ambulatory cardiac monitor. This records your heartbeats for 24 hours or more.  Treatment depends on any underlying conditions, the type of PVCs that you are having, and how much the symptoms are interfering with your daily life. This information is not intended to replace advice given to you by your health care provider. Make sure you discuss any questions you have with  your health care provider. Document Released: 12/24/2003 Document Revised: 12/21/2017 Document Reviewed: 06/26/2017 Elsevier Interactive Patient Education  2019 [employer].      Follow-up plan: Return if symptoms worsen or fail to improve, otherwise as scheduled for physical .                             ############################################ ############################################ ############################################ ############################################    Current Meds  Medication Sig  . ALPRAZolam (XANAX) 0.5 MG tablet Take 0.5-1 tablets (0.25-0.5 mg total) by mouth at bedtime as needed for anxiety (Use  sparingly to prevent tolerance/dependence).  [name] buPROPion (WELLBUTRIN XL) 150 MG 24 hr tablet TAKE 1 TABLET BY MOUTH ONCE DAILY  . loperamide (IMODIUM A-D) 2 MG tablet Take 0.5-1 tablets (1-2 mg total) by mouth 4 (four) times daily -  before meals and at bedtime.  [name] omeprazole (PRILOSEC) 20 MG capsule Take 1 capsule (20 mg total) by mouth daily.  . simvastatin (ZOCOR) 40 MG tablet Take 0.5 tablets (20 mg total) by mouth at bedtime.  . valACYclovir (VALTREX) 1000 MG tablet Take 1 tablet (1,000 mg total) by mouth 2 (two) times daily. For 7-10 days    Allergies  Allergen Reactions  . Kiwi Extract Itching    Throats itches       Review of Systems:  Constitutional: No recent illness  HEENT: No  headache, no vision change  Cardiac: No  chest pain, No  pressure, +palpitations  Respiratory:  No  shortness of breath. No  Cough  Gastrointestinal: No  abdominal pain  Musculoskeletal: No new myalgia/arthralgia  Skin: No  Rash  Neurologic: No  weakness, No  Dizziness  Psychiatric: No  concerns with depression, No  concerns with anxiety  Exam:  BP (!) 142/80 (BP Location: Left Arm, Patient Position: Sitting, Cuff Size: Normal)   Pulse 88   Temp 97.6 F (36.4 C) (Oral)   Wt 169 lb 1.6 oz (76.7 kg)   BMI 33.03 kg/m   Constitutional: VS see above. General Appearance: alert, well-developed, well-nourished, NAD  Eyes: Normal lids and conjunctive, non-icteric sclera  Ears, Nose, Mouth, Throat: MMM, Normal external inspection ears/nares/mouth/lips/gums.  Neck: No masses, trachea midline.   Respiratory: Normal respiratory effort. no wheeze, no rhonchi, no rales  Cardiovascular: S1/S2 normal, no murmur, no rub/gallop auscultated. RRR.  Occasional audible and premature beats with brief pause following  Musculoskeletal: Gait normal. Symmetric and independent movement of all extremities  Neurological: Normal balance/coordination. No tremor.  Skin: warm, dry, intact.   Psychiatric:  Normal judgment/insight. Normal mood and affect. Oriented x3.       Visit summary with medication list and pertinent instructions was printed for patient to review, patient was advised to alert [place] if any updates are needed. All questions at time of visit were answered - patient instructed to contact office with any additional concerns. ER/RTC precautions were reviewed with the patient and understanding verbalized.     Please note: voice recognition software was used to produce this document, and typos may escape review. Please contact Dr. Lyn Hollingshead for any needed clarifications.    Follow up plan: Return if symptoms worsen or fail to improve, otherwise as scheduled for physical .

## 2018-05-09 LAB — URINALYSIS, ROUTINE W REFLEX MICROSCOPIC
BILIRUBIN URINE: NEGATIVE
GLUCOSE, UA: NEGATIVE
Hgb urine dipstick: NEGATIVE
Ketones, ur: NEGATIVE
Leukocytes, UA: NEGATIVE
Nitrite: NEGATIVE
PH: 6.5 (ref 5.0–8.0)
PROTEIN: NEGATIVE
Specific Gravity, Urine: 1.006 (ref 1.001–1.03)

## 2018-05-09 LAB — CBC
HEMATOCRIT: 37.5 % (ref 35.0–45.0)
Hemoglobin: 13.1 g/dL (ref 11.7–15.5)
MCH: 33 pg (ref 27.0–33.0)
MCHC: 34.9 g/dL (ref 32.0–36.0)
MCV: 94.5 fL (ref 80.0–100.0)
MPV: 11 fL (ref 7.5–12.5)
Platelets: 339 10*3/uL (ref 140–400)
RBC: 3.97 10*6/uL (ref 3.80–5.10)
RDW: 12.8 % (ref 11.0–15.0)
WBC: 8 10*3/uL (ref 3.8–10.8)

## 2018-05-09 LAB — LIPID PANEL
CHOL/HDL RATIO: 3.3 (calc) (ref <5.0)
CHOLESTEROL: 202 mg/dL — AB (ref <200)
HDL: 62 mg/dL (ref >50)
LDL Cholesterol (Calc): 106 mg/dL (calc) — ABNORMAL HIGH
Non-HDL Cholesterol (Calc): 140 mg/dL (calc) — ABNORMAL HIGH (ref <130)
Triglycerides: 216 mg/dL — ABNORMAL HIGH (ref <150)

## 2018-05-09 LAB — MAGNESIUM: MAGNESIUM: 1.9 mg/dL (ref 1.5–2.5)

## 2018-05-09 LAB — COMPLETE METABOLIC PANEL WITH GFR
AG Ratio: 1.6 (calc) (ref 1.0–2.5)
ALBUMIN MSPROF: 4.1 g/dL (ref 3.6–5.1)
ALKALINE PHOSPHATASE (APISO): 100 U/L (ref 33–130)
ALT: 14 U/L (ref 6–29)
AST: 14 U/L (ref 10–35)
BILIRUBIN TOTAL: 0.3 mg/dL (ref 0.2–1.2)
BUN: 12 mg/dL (ref 7–25)
CO2: 29 mmol/L (ref 20–32)
CREATININE: 0.78 mg/dL (ref 0.50–0.99)
Calcium: 9.6 mg/dL (ref 8.6–10.4)
Chloride: 105 mmol/L (ref 98–110)
GFR, EST AFRICAN AMERICAN: 95 mL/min/{1.73_m2} (ref >=60)
GFR, Est Non African American: 82 mL/min/{1.73_m2} (ref >=60)
GLOBULIN: 2.5 g/dL (ref 1.9–3.7)
Glucose, Bld: 94 mg/dL (ref 65–99)
Potassium: 4.2 mmol/L (ref 3.5–5.3)
SODIUM: 141 mmol/L (ref 135–146)
Total Protein: 6.6 g/dL (ref 6.1–8.1)

## 2018-05-09 LAB — TSH: TSH: 4.41 mIU/L (ref 0.40–4.50)

## 2018-05-17 NOTE — Addendum Note (Signed)
Addended by: Mallie SnooksERSKINE, Vincent Streater R on: 05/17/2018 09:52 AM   Modules accepted: Orders

## 2018-10-08 ENCOUNTER — Encounter: Payer: Self-pay | Admitting: Osteopathic Medicine

## 2018-10-08 DIAGNOSIS — E7849 Other hyperlipidemia: Secondary | ICD-10-CM

## 2018-10-08 MED ORDER — SIMVASTATIN 40 MG PO TABS: 20.0000 mg | ORAL_TABLET | Freq: Every day | ORAL | 1 refills | Status: DC

## 2019-01-24 ENCOUNTER — Other Ambulatory Visit: Payer: Self-pay | Admitting: Osteopathic Medicine

## 2019-01-24 DIAGNOSIS — F419 Anxiety disorder, unspecified: Secondary | ICD-10-CM

## 2019-01-24 NOTE — Telephone Encounter (Signed)
Forwarding medication refill request to PCP for review. 

## 2019-04-08 ENCOUNTER — Other Ambulatory Visit: Payer: Self-pay | Admitting: Osteopathic Medicine

## 2019-04-08 DIAGNOSIS — F419 Anxiety disorder, unspecified: Secondary | ICD-10-CM

## 2019-04-08 NOTE — Telephone Encounter (Signed)
Requested medication (s) are due for refill today: yes  Requested medication (s) are on the active medication list: yes  Last refill:  01/28/2019  Future visit scheduled: yes  Notes to clinic:  Overdue for office visit  Review for refill   Requested Prescriptions  Pending Prescriptions Disp Refills   buPROPion (WELLBUTRIN XL) 150 MG 24 hr tablet [Pharmacy Med Name: buPROPion HCL XL 150 MG TABLET] 90 tablet 0    Sig: TAKE ONE TABLET BY MOUTH DAILY     Psychiatry: Antidepressants - bupropion Failed - 04/08/2019 10:14 AM      Failed - Last BP in normal range    BP Readings from Last 1 Encounters:  05/08/18 (!) 142/80         Failed - Valid encounter within last 6 months    Recent Outpatient Visits          11 months ago PVC (premature ventricular contraction)   [facility], Lanelle Bal, DO   1 year ago Blistering rash   [facility], Lanelle Bal, DO   1 year ago Annual physical exam   [facility], Lanelle Bal, DO   2 years ago Irritable bowel syndrome with diarrhea   [facility] [place], DO             Failed - Completed PHQ-2 or PHQ-9 in the last 360 days.       omeprazole (PRILOSEC) 20 MG capsule [Pharmacy Med Name: OMEPRAZOLE DR 20 MG CAPSULE] 90 capsule 2    Sig: TAKE ONE CAPSULE BY MOUTH DAILY     Gastroenterology: Proton Pump Inhibitors Failed - 04/08/2019 10:14 AM      Failed - Valid encounter within last 12 months    Recent Outpatient Visits          11 months ago PVC (premature ventricular contraction)   [facility], Lanelle Bal, DO   1 year ago Blistering rash   [facility], Lanelle Bal, DO   1 year ago Annual physical exam   [facility], Lanelle Bal, DO   2  years ago Irritable bowel syndrome with diarrhea   [facility], [place], DO

## 2019-05-02 ENCOUNTER — Other Ambulatory Visit: Payer: Self-pay | Admitting: Osteopathic Medicine

## 2019-05-02 DIAGNOSIS — F419 Anxiety disorder, unspecified: Secondary | ICD-10-CM

## 2019-05-02 NOTE — Telephone Encounter (Signed)
Has appointment with you on 06/23/19. Which quantity do you prefer she have?

## 2019-05-02 NOTE — Telephone Encounter (Signed)
Requested medication (s) are due for refill today: yes  Requested medication (s) are on the active medication list: no  Last refill:  04/08/2019  Future visit scheduled: no  Notes to clinic:  review for refill   Requested Prescriptions  Pending Prescriptions Disp Refills   buPROPion (WELLBUTRIN XL) 150 MG 24 hr tablet [Pharmacy Med Name: BUPROPION HCL XL 150 MG TABLET] 30 tablet 0    Sig: TAKE ONE TABLET BY MOUTH DAILY      Psychiatry: Antidepressants - bupropion Failed - 05/02/2019  9:32 AM      Failed - Last BP in normal range    BP Readings from Last 1 Encounters:  05/08/18 (!) 142/80          Failed - Valid encounter within last 6 months    Recent Outpatient Visits           11 months ago PVC (premature ventricular contraction)   [facility], Lanelle Bal, DO   1 year ago Blistering rash   [facility], Lanelle Bal, DO   1 year ago Annual physical exam   [facility], Lanelle Bal, DO   2 years ago Irritable bowel syndrome with diarrhea   [facility] [place], DO              Failed - Completed PHQ-2 or PHQ-9 in the last 360 days.        buPROPion (WELLBUTRIN XL) 150 MG 24 hr tablet [Pharmacy Med Name: buPROPion HCL XL 150 MG TABLET] 90 tablet 0    Sig: TAKE ONE TABLET BY MOUTH DAILY      Psychiatry: Antidepressants - bupropion Failed - 05/02/2019  9:32 AM      Failed - Last BP in normal range    BP Readings from Last 1 Encounters:  05/08/18 (!) 142/80          Failed - Valid encounter within last 6 months    Recent Outpatient Visits           11 months ago PVC (premature ventricular contraction)   [facility], Lanelle Bal, DO   1 year ago Blistering rash   [facility], Lanelle Bal, DO   1 year ago  Annual physical exam   [facility], Lanelle Bal, DO   2 years ago Irritable bowel syndrome with diarrhea   [facility], Lanelle Bal, DO              Failed - Completed PHQ-2 or PHQ-9 in the last 360 days.

## 2019-05-31 ENCOUNTER — Other Ambulatory Visit: Payer: Self-pay | Admitting: Osteopathic Medicine

## 2019-06-23 ENCOUNTER — Other Ambulatory Visit: Payer: Self-pay

## 2019-06-23 ENCOUNTER — Ambulatory Visit (INDEPENDENT_AMBULATORY_CARE_PROVIDER_SITE_OTHER): Payer: Self-pay | Admitting: Osteopathic Medicine

## 2019-06-23 ENCOUNTER — Encounter: Payer: Self-pay | Admitting: Osteopathic Medicine

## 2019-06-23 VITALS — BP 144/92 | HR 80 | Temp 98.1°F | Ht 60.0 in | Wt 173.0 lb

## 2019-06-23 DIAGNOSIS — E7849 Other hyperlipidemia: Secondary | ICD-10-CM

## 2019-06-23 DIAGNOSIS — F419 Anxiety disorder, unspecified: Secondary | ICD-10-CM

## 2019-06-23 DIAGNOSIS — Z0000 Encounter for general adult medical examination without abnormal findings: Secondary | ICD-10-CM

## 2019-06-23 MED ORDER — ALPRAZOLAM 0.5 MG PO TABS: 0.2500 mg | ORAL_TABLET | Freq: Every evening | ORAL | 0 refills | Status: DC | PRN

## 2019-06-23 MED ORDER — OMEPRAZOLE 20 MG PO CPDR: 20.0000 mg | DELAYED_RELEASE_CAPSULE | Freq: Every day | ORAL | 3 refills | Status: DC

## 2019-06-23 MED ORDER — SIMVASTATIN 40 MG PO TABS: 20.0000 mg | ORAL_TABLET | Freq: Every day | ORAL | 1 refills | Status: DC

## 2019-06-23 MED ORDER — BUPROPION HCL ER (XL) 150 MG PO TB24: 150.0000 mg | ORAL_TABLET | Freq: Every day | ORAL | 3 refills | Status: DC

## 2019-06-23 NOTE — Patient Instructions (Addendum)
General Preventive Care  Most recent routine screening lipids/other labs: ordered   Everyone should have blood pressure checked once per year.   Tobacco: don't!   Alcohol: responsible moderation is ok for most adults - if you have concerns about your alcohol intake, please talk to me!   Exercise: as tolerated to reduce risk of cardiovascular disease and diabetes. Strength training will also prevent osteoporosis.   Mental health: if need for mental health care (medicines, counseling, other), or concerns about moods, please let me know!   Sexual health: if need for STD testing, or if concerns with libido/pain problems, please let me know!  Advanced Directive: Living Will and/or Healthcare Power of Attorney recommended for all adults, regardless of age or health.  Vaccines  Flu vaccine: recommended for almost everyone, every fall.   Shingles vaccine: Shingrix recommended after age 43.  Pneumonia vaccines: repeat at age 89   Tetanus booster: Tdap recommended every 10 years. Due 2026  COVID: recommended! See [email or web address]  Cancer screenings   Colon cancer screening: due 06/2021  Breast cancer screening: mammogram recommended annually after age 9.   Cervical cancer screening: Pap due 06/2021   Lung cancer screening: not needed since you quit smoking >15 years ago.  Infection screenings . HIV: recommended screening at least once age 46-65, more often as needed. . Gonorrhea/Chlamydia: screening as needed . Hepatitis C: recommended for anyone born 61-1964 . TB: certain at-risk populations, or depending on work requirements and/or travel history Other . Bone Density Test: recommended for women at age 4

## 2019-06-23 NOTE — Progress Notes (Signed)
HPI: Dawn Dunlap is a 64 y.o. female who  has a past medical history of GERD (gastroesophageal reflux disease) and Hyperlipidemia.  she presents to [facility] today, 06/23/19,  for chief complaint of: Annual physical     Patient here for annual physical / wellness exam.  See preventive care reviewed as below.    Additional concerns today include:  None     Past medical, surgical, social and family history reviewed:  Patient Active Problem List   Diagnosis Date Noted  . Hyperplastic polyp of sigmoid colon 10/10/2016  . GERD (gastroesophageal reflux disease) 06/20/2016  . Hyperlipidemia 06/20/2016  . Irritable bowel syndrome with diarrhea 06/20/2016    Past Surgical History:  Procedure Laterality Date  . COLONOSCOPY  2012  . LASER LAPAROSCOPY     X2  . PELVIC LAPAROSCOPY  1991/1992  . 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 TONSILLECTOMY      Social History   Tobacco Use  . Smoking status: Former Smoker    Types: E-cigarettes    Quit date: 05/22/1990    Years since quitting: 29.1  . Smokeless tobacco: Never Used  Substance Use Topics  . Alcohol use: Yes    Comment: social/occasional     Family History  Problem Relation Age of Onset  . Dementia Mother      Current medication list and allergy/intolerance information reviewed:    Current Outpatient Medications  Medication Sig Dispense Refill  . ALPRAZolam (XANAX) 0.5 MG tablet Take 0.5-1 tablets (0.25-0.5 mg total) by mouth at bedtime as needed for anxiety (Use sparingly to prevent tolerance/dependence). 30 tablet 0  . buPROPion (WELLBUTRIN XL) 150 MG 24 hr tablet Take 1 tablet (150 mg total) by mouth daily. 90 tablet 3  . omeprazole (PRILOSEC) 20 MG capsule Take 1 capsule (20 mg total) by mouth daily. 90 capsule 3  . simvastatin (ZOCOR) 40 MG tablet Take 0.5 tablets (20 mg total) by mouth at bedtime. 90 tablet 1   No current facility-administered medications for  this visit.    Allergies  Allergen Reactions  . Kiwi Extract Itching    Throats itches      Review of Systems:  Constitutional:  No  fever, no chills, No recent illness  HEENT: No  headache, no vision change, no hearing change, No sore throat, No  sinus pressure  Cardiac: No  chest pain, No  pressure, No palpitations,  Respiratory:  No  shortness of breath. No  Cough  Gastrointestinal: No  abdominal pain, No  nausea, No  vomiting,  No  blood in stool, No  diarrhea, No  constipation   Musculoskeletal: No new myalgia/arthralgia  Skin: No  Rash, No other wounds/concerning lesions  Genitourinary: No  incontinence, No  abnormal genital bleeding, No abnormal genital discharge  Neurologic: No  weakness, No  dizziness  Psychiatric: No  concerns with depression, No  concerns with anxiety, No sleep problems, No mood problems  Exam:  BP (!) 144/92 (BP Location: Left Arm, Patient Position: Sitting, Cuff Size: Normal)   Pulse 80   Temp 98.1 F (36.7 C) (Oral)   Ht 5' (1.524 m)   Wt 173 lb 0.6 oz (78.5 kg)   BMI 33.79 kg/m   Constitutional: VS see above. General Appearance: alert, well-developed, well-nourished, NAD  Eyes: Normal lids and conjunctive, non-icteric sclera  Ears, Nose, Mouth, Throat: MMM, Normal external inspection ears/nares/mouth/lips/gums. TM normal bilaterally. Pharynx/tonsils no erythema, no exudate. Nasal mucosa normal.  Neck: No masses, trachea midline. No thyroid enlargement. No tenderness/mass appreciated. No lymphadenopathy  Respiratory: Normal respiratory effort. no wheeze, no rhonchi, no rales  Cardiovascular: S1/S2 normal, no murmur, no rub/gallop auscultated. RRR. No lower extremity edema. Pedal pulse II/IV bilaterally DP and PT. No carotid bruit or JVD. No abdominal aortic bruit.  Gastrointestinal: Nontender, no masses. No hepatomegaly,   Musculoskeletal: Gait normal. No clubbing/cyanosis of digits.   Neurological: Normal balance/coordination.  No tremor. No cranial nerve deficit on limited exam. Motor and sensation intact and symmetric. Cerebellar reflexes intact.   Skin: warm, dry, intact. No rash/ulcer. No concerning nevi or subq nodules on limited exam.    Psychiatric: Normal judgment/insight. Normal mood and affect. Oriented x3.    No results found for this or any previous visit (from the past 72 hour(s)).  No results found.   ASSESSMENT/PLAN: The primary encounter diagnosis was Annual physical exam. Diagnoses of Anxiety and Other hyperlipidemia were also pertinent to this visit.  Borderline BP, pt will check at home, no CP/SOB/HA, discussed goals for BP control   Orders Placed This Encounter  Procedures  . COMPLETE METABOLIC PANEL WITH GFR  . Lipid panel    Meds ordered this encounter  Medications  . ALPRAZolam (XANAX) 0.5 MG tablet    Sig: Take 0.5-1 tablets (0.25-0.5 mg total) by mouth at bedtime as needed for anxiety (Use sparingly to prevent tolerance/dependence).    Dispense:  30 tablet    Refill:  0  . buPROPion (WELLBUTRIN XL) 150 MG 24 hr tablet    Sig: Take 1 tablet (150 mg total) by mouth daily.    Dispense:  90 tablet    Refill:  3  . omeprazole (PRILOSEC) 20 MG capsule    Sig: Take 1 capsule (20 mg total) by mouth daily.    Dispense:  90 capsule    Refill:  3  . simvastatin (ZOCOR) 40 MG tablet    Sig: Take 0.5 tablets (20 mg total) by mouth at bedtime.    Dispense:  90 tablet    Refill:  1    Patient Instructions  General Preventive Care  Most recent routine screening lipids/other labs: ordered   Everyone should have blood pressure checked once per year.   Tobacco: don't!   Alcohol: responsible moderation is ok for most adults - if you have concerns about your alcohol intake, please talk to me!   Exercise: as tolerated to reduce risk of cardiovascular disease and diabetes. Strength training will also prevent osteoporosis.   Mental health: if need for mental health care (medicines,  counseling, other), or concerns about moods, please let me know!   Sexual health: if need for STD testing, or if concerns with libido/pain problems, please let me know!  Advanced Directive: Living Will and/or Healthcare Power of Attorney recommended for all adults, regardless of age or health.  Vaccines  Flu vaccine: recommended for almost everyone, every fall.   Shingles vaccine: Shingrix recommended after age 43.  Pneumonia vaccines: repeat at age 71   Tetanus booster: Tdap recommended every 10 years. Due 2026  COVID: recommended! See [email or web address]  Cancer screenings   Colon cancer screening: due 06/2021  Breast cancer screening: mammogram recommended annually after age 34.   Cervical cancer screening: Pap due 06/2021   Lung cancer screening: not needed since you quit smoking >15 years ago.  Infection screenings . HIV: recommended screening at least once age 25-65, more often as needed. . Gonorrhea/Chlamydia: screening as needed . Hepatitis C: recommended  for anyone born 53-1964 . TB: certain at-risk populations, or depending on work requirements and/or travel history Other . Bone Density Test: recommended for women at age 81        Visit summary with medication list and pertinent instructions was printed for patient to review. All questions at time of visit were answered - patient instructed to contact office with any additional concerns or updates. ER/RTC precautions were reviewed with the patient.    Please note: voice recognition software was used to produce this document, and typos may escape review. Please contact Dr. Sheppard Coil for any needed clarifications.     Follow-up plan: Return in about 1 year (around 06/22/2020) for [place] (call week prior to visit for lab orders).

## 2019-07-23 ENCOUNTER — Other Ambulatory Visit: Payer: Self-pay | Admitting: Osteopathic Medicine

## 2019-07-23 DIAGNOSIS — Z1231 Encounter for screening mammogram for malignant neoplasm of breast: Secondary | ICD-10-CM

## 2019-07-24 ENCOUNTER — Ambulatory Visit (INDEPENDENT_AMBULATORY_CARE_PROVIDER_SITE_OTHER): Payer: Self-pay

## 2019-07-24 ENCOUNTER — Other Ambulatory Visit: Payer: Self-pay

## 2019-07-24 DIAGNOSIS — Z1231 Encounter for screening mammogram for malignant neoplasm of breast: Secondary | ICD-10-CM

## 2019-07-24 LAB — COMPLETE METABOLIC PANEL WITH GFR
AG Ratio: 1.8 (calc) (ref 1.0–2.5)
ALT: 17 U/L (ref 6–29)
AST: 15 U/L (ref 10–35)
Albumin: 4.1 g/dL (ref 3.6–5.1)
Alkaline phosphatase (APISO): 84 U/L (ref 37–153)
BUN: 17 mg/dL (ref 7–25)
CO2: 27 mmol/L (ref 20–32)
Calcium: 9 mg/dL (ref 8.6–10.4)
Chloride: 104 mmol/L (ref 98–110)
Creat: 0.74 mg/dL (ref 0.50–0.99)
GFR, Est African American: 101 mL/min/{1.73_m2} (ref >=60)
GFR, Est Non African American: 87 mL/min/{1.73_m2} (ref >=60)
Globulin: 2.3 g/dL (calc) (ref 1.9–3.7)
Glucose, Bld: 94 mg/dL (ref 65–99)
Potassium: 4.4 mmol/L (ref 3.5–5.3)
Sodium: 139 mmol/L (ref 135–146)
Total Bilirubin: 0.4 mg/dL (ref 0.2–1.2)
Total Protein: 6.4 g/dL (ref 6.1–8.1)

## 2019-07-24 LAB — LIPID PANEL
Cholesterol: 196 mg/dL (ref <200)
HDL: 57 mg/dL (ref >=50)
LDL Cholesterol (Calc): 102 mg/dL (calc) — ABNORMAL HIGH
Non-HDL Cholesterol (Calc): 139 mg/dL (calc) — ABNORMAL HIGH (ref <130)
Total CHOL/HDL Ratio: 3.4 (calc) (ref <5.0)
Triglycerides: 256 mg/dL — ABNORMAL HIGH (ref <150)

## 2019-10-03 ENCOUNTER — Telehealth: Payer: Self-pay

## 2019-10-03 MED ORDER — VALACYCLOVIR HCL 1 G PO TABS: 2000.0000 mg | ORAL_TABLET | Freq: Two times a day (BID) | ORAL | 0 refills | Status: AC

## 2019-10-03 NOTE — Telephone Encounter (Signed)
Sent!

## 2019-10-03 NOTE — Telephone Encounter (Signed)
Pt called requesting a med refill for valacyclovir. As per pt, she is currently has sores on her lips. Rx not listed in active med list. Requesting that rx is sent to [facility] pharmacy.

## 2019-10-03 NOTE — Telephone Encounter (Signed)
Pt has been updated regarding med refill for valcyclovir. No other inquiries during the call.

## 2020-04-20 ENCOUNTER — Other Ambulatory Visit: Payer: Self-pay | Admitting: Osteopathic Medicine

## 2020-04-20 DIAGNOSIS — Z1231 Encounter for screening mammogram for malignant neoplasm of breast: Secondary | ICD-10-CM

## 2020-05-28 ENCOUNTER — Other Ambulatory Visit: Payer: Self-pay | Admitting: Osteopathic Medicine

## 2020-05-28 DIAGNOSIS — E7849 Other hyperlipidemia: Secondary | ICD-10-CM

## 2020-06-15 LAB — HM MAMMOGRAPHY

## 2020-06-16 ENCOUNTER — Ambulatory Visit: Payer: Self-pay

## 2020-06-17 ENCOUNTER — Other Ambulatory Visit: Payer: Self-pay | Admitting: Osteopathic Medicine

## 2020-06-17 ENCOUNTER — Telehealth: Payer: Self-pay | Admitting: *Deleted

## 2020-06-17 DIAGNOSIS — E7849 Other hyperlipidemia: Secondary | ICD-10-CM

## 2020-06-17 DIAGNOSIS — Z0000 Encounter for general adult medical examination without abnormal findings: Secondary | ICD-10-CM

## 2020-06-17 LAB — COMPLETE METABOLIC PANEL WITH GFR
AG Ratio: 1.8 (calc) (ref 1.0–2.5)
ALT: 14 U/L (ref 6–29)
AST: 13 U/L (ref 10–35)
Albumin: 4.2 g/dL (ref 3.6–5.1)
Alkaline phosphatase (APISO): 86 U/L (ref 37–153)
BUN: 12 mg/dL (ref 7–25)
CO2: 29 mmol/L (ref 20–32)
Calcium: 9.5 mg/dL (ref 8.6–10.4)
Chloride: 101 mmol/L (ref 98–110)
Creat: 0.69 mg/dL (ref 0.50–0.99)
GFR, Est African American: 107 mL/min/{1.73_m2} (ref >=60)
GFR, Est Non African American: 93 mL/min/{1.73_m2} (ref >=60)
Globulin: 2.4 g/dL (calc) (ref 1.9–3.7)
Glucose, Bld: 86 mg/dL (ref 65–99)
Potassium: 4.4 mmol/L (ref 3.5–5.3)
Sodium: 138 mmol/L (ref 135–146)
Total Bilirubin: 0.4 mg/dL (ref 0.2–1.2)
Total Protein: 6.6 g/dL (ref 6.1–8.1)

## 2020-06-17 LAB — LIPID PANEL W/REFLEX DIRECT LDL
Cholesterol: 209 mg/dL — ABNORMAL HIGH (ref <200)
HDL: 67 mg/dL (ref >=50)
LDL Cholesterol (Calc): 112 mg/dL (calc) — ABNORMAL HIGH
Non-HDL Cholesterol (Calc): 142 mg/dL (calc) — ABNORMAL HIGH (ref <130)
Total CHOL/HDL Ratio: 3.1 (calc) (ref <5.0)
Triglycerides: 179 mg/dL — ABNORMAL HIGH (ref <150)

## 2020-06-17 NOTE — Telephone Encounter (Signed)
Labs ordered for upcoming CPE. 

## 2020-06-23 ENCOUNTER — Encounter: Payer: Self-pay | Admitting: Osteopathic Medicine

## 2020-06-23 ENCOUNTER — Ambulatory Visit (INDEPENDENT_AMBULATORY_CARE_PROVIDER_SITE_OTHER): Payer: No Typology Code available for payment source | Admitting: Osteopathic Medicine

## 2020-06-23 ENCOUNTER — Other Ambulatory Visit: Payer: Self-pay

## 2020-06-23 VITALS — BP 130/85 | HR 90 | Temp 98.0°F | Wt 165.0 lb

## 2020-06-23 DIAGNOSIS — E7849 Other hyperlipidemia: Secondary | ICD-10-CM | POA: Diagnosis not present

## 2020-06-23 DIAGNOSIS — K219 Gastro-esophageal reflux disease without esophagitis: Secondary | ICD-10-CM

## 2020-06-23 DIAGNOSIS — F419 Anxiety disorder, unspecified: Secondary | ICD-10-CM

## 2020-06-23 DIAGNOSIS — Z0000 Encounter for general adult medical examination without abnormal findings: Secondary | ICD-10-CM

## 2020-06-23 MED ORDER — OMEPRAZOLE 20 MG PO CPDR: 20.0000 mg | DELAYED_RELEASE_CAPSULE | Freq: Every day | ORAL | 3 refills | Status: DC

## 2020-06-23 MED ORDER — BUPROPION HCL ER (XL) 150 MG PO TB24: 150.0000 mg | ORAL_TABLET | Freq: Every day | ORAL | 3 refills | Status: DC

## 2020-06-23 MED ORDER — ALPRAZOLAM 0.5 MG PO TABS: 0.2500 mg | ORAL_TABLET | Freq: Every evening | ORAL | 0 refills | Status: AC | PRN

## 2020-06-23 MED ORDER — BETAMETHASONE DIPROPIONATE 0.05 % EX CREA: TOPICAL_CREAM | Freq: Two times a day (BID) | CUTANEOUS | 1 refills | Status: DC

## 2020-06-23 MED ORDER — SIMVASTATIN 40 MG PO TABS
20.0000 mg | ORAL_TABLET | Freq: Every day | ORAL | 3 refills | Status: DC
Start: 2020-06-23 — End: 2021-07-21

## 2020-06-23 NOTE — Patient Instructions (Signed)
General Preventive Care  Most recent routine screening labs: see attached.   Blood pressure goal 130/80 or less.   Tobacco: don't!   Alcohol: responsible moderation is ok for most adults - if you have concerns about your alcohol intake, please talk to me!   Exercise: as tolerated to reduce risk of cardiovascular disease and diabetes. Strength training will also prevent osteoporosis.   Mental health: if need for mental health care (medicines, counseling, other), or concerns about moods, please let me know!   Sexual / Reproductive health: if need for STD testing, or if concerns with libido/pain problems, please let me know!   Advanced Directive: Living Will and/or Healthcare Power of Attorney recommended for all adults, regardless of age or health.  Vaccines  Flu vaccine: for almost everyone, every fall.   Shingles vaccine: after age 15.   Pneumonia vaccines: boosters after age 60.   Tetanus booster: every 10 years - due 2026  COVID vaccine: THANKS for getting your vaccine! :) Cancer screenings   Colon cancer screening: Colonoscopy due 06/2021  Breast cancer screening: mammogram annually after age 10, due 07/2020, please call [phone] to schedule   Cervical cancer screening: Pap due 03/2023  Lung cancer screening: not needed since quit >15 years ago Infection screenings  . HIV: recommended screening at least once age 97-65 . Gonorrhea/Chlamydia: screening as needed . Hepatitis C: recommended once for everyone age 70-75 . TB: certain at-risk populations, or depending on work requirements and/or travel history Other . Bone Density Test: recommended for women at age 81

## 2020-06-23 NOTE — Progress Notes (Signed)
HPI: Dawn Dunlap is a 65 y.o. female who  has a past medical history of GERD (gastroesophageal reflux disease) and Hyperlipidemia.  she presents to [facility] today, 06/23/20,  for chief complaint of:  Physical   No major concerns today other than noticing some increased anxiety/stress and itching rash on left arm.  Anxiety is mostly to do with some recent conflicts with her ex-husband and their daughter.  Patient states that she has had to revisit alimony situation recently, does not foresee this being a long-term.  Of high anxiety but has noticed that she is taking Xanax a bit more frequently.  As needed she is taking 1/2-1/4 of a pill.   Rash, photo below.  No previous similar.  No blistering/scaling/redness.  See photographs.          ASSESSMENT/PLAN: The primary encounter diagnosis was Annual physical exam. Diagnoses of Other hyperlipidemia, Gastroesophageal reflux disease, unspecified whether esophagitis present, and Anxiety were also pertinent to this visit.  Rash seems consistent with possible atypical eczema, less likely but differential diagnosis would still include shingles.  Trial topical corticosteroid and plan for biopsy if not improving.  Patient does not feel need for daily antianxiety treatment such as SSRI, is okay to refill alprazolam at this point and let me know if she feels her situation changes such that she wants to consider counseling/therapy and/or SSRI   No orders of the defined types were placed in this encounter.    Meds ordered this encounter  Medications  . betamethasone dipropionate 0.05 % cream    Sig: Apply topically 2 (two) times daily. To affected area(s) as needed    Dispense:  45 g    Refill:  1  . ALPRAZolam (XANAX) 0.5 MG tablet    Sig: Take 0.5-1 tablets (0.25-0.5 mg total) by mouth at bedtime as needed for anxiety (Use sparingly to prevent tolerance/dependence).    Dispense:  30 tablet     Refill:  0  . buPROPion (WELLBUTRIN XL) 150 MG 24 hr tablet    Sig: Take 1 tablet (150 mg total) by mouth daily.    Dispense:  90 tablet    Refill:  3  . omeprazole (PRILOSEC) 20 MG capsule    Sig: Take 1 capsule (20 mg total) by mouth daily.    Dispense:  90 capsule    Refill:  3  . simvastatin (ZOCOR) 40 MG tablet    Sig: Take 0.5 tablets (20 mg total) by mouth daily.    Dispense:  45 tablet    Refill:  3    Patient Instructions  General Preventive Care  Most recent routine screening labs: see attached.   Blood pressure goal 130/80 or less.   Tobacco: don't!   Alcohol: responsible moderation is ok for most adults - if you have concerns about your alcohol intake, please talk to me!   Exercise: as tolerated to reduce risk of cardiovascular disease and diabetes. Strength training will also prevent osteoporosis.   Mental health: if need for mental health care (medicines, counseling, other), or concerns about moods, please let me know!   Sexual / Reproductive health: if need for STD testing, or if concerns with libido/pain problems, please let me know!   Advanced Directive: Living Will and/or Healthcare Power of Attorney recommended for all adults, regardless of age or health.  Vaccines  Flu vaccine: for almost everyone, every fall.   Shingles vaccine: after age 52.   Pneumonia vaccines: boosters after age  65.   Tetanus booster: every 10 years - due 2026  COVID vaccine: THANKS for getting your vaccine! :) Cancer screenings   Colon cancer screening: Colonoscopy due 06/2021  Breast cancer screening: mammogram annually after age 92, due 07/2020, please call [phone] to schedule   Cervical cancer screening: Pap due 03/2023  Lung cancer screening: not needed since quit >15 years ago Infection screenings  . HIV: recommended screening at least once age 16-65 . Gonorrhea/Chlamydia: screening as needed . Hepatitis C: recommended once for everyone age 53-75 . TB:  certain at-risk populations, or depending on work requirements and/or travel history Other . Bone Density Test: recommended for women at age 80     Follow-up plan: Return in about 1 year (around 06/23/2021) for [employer] - SEE [place] SOONER IF NEEDED.                                                 ################################################# ################################################# ################################################# #################################################    Current Meds  Medication Sig  . betamethasone dipropionate 0.05 % cream Apply topically 2 (two) times daily. To affected area(s) as needed  . [DISCONTINUED] ALPRAZolam (XANAX) 0.5 MG tablet Take 0.5-1 tablets (0.25-0.5 mg total) by mouth at bedtime as needed for anxiety (Use sparingly to prevent tolerance/dependence).  . [DISCONTINUED] buPROPion (WELLBUTRIN XL) 150 MG 24 hr tablet Take 1 tablet (150 mg total) by mouth daily.  . [DISCONTINUED] omeprazole (PRILOSEC) 20 MG capsule Take 1 capsule (20 mg total) by mouth daily.  . [DISCONTINUED] simvastatin (ZOCOR) 40 MG tablet Take 0.5 tablets (20 mg total) by mouth daily. appt for refills    Allergies  Allergen Reactions  . Kiwi Extract Itching    Throats itches       Review of Systems: Pertinent (+) and (-) ROS in HPI as above   Exam:  BP 130/85 (BP Location: Left Arm, Patient Position: Sitting, Cuff Size: Normal)   Pulse 90   Temp 98 F (36.7 C) (Oral)   Wt 165 lb 0.6 oz (74.9 kg)   BMI 32.23 kg/m   Constitutional: VS see above. General Appearance: alert, well-developed, well-nourished, NAD  Neck: No masses, trachea midline.   Respiratory: Normal respiratory effort. no wheeze, no rhonchi, no rales  Cardiovascular: S1/S2 normal, no murmur, no rub/gallop auscultated. RRR.   Musculoskeletal: Gait normal. Symmetric and independent movement of all extremities  Abdominal:  non-tender, non-distended, no appreciable organomegaly, neg Murphy's, BS WNLx4  Neurological: Normal balance/coordination. No tremor.  Skin: warm, dry, intact.   Psychiatric: Normal judgment/insight. Normal mood and affect. Oriented x3.       Visit summary with medication list and pertinent instructions was printed for patient to review, patient was advised to alert [place] if any updates are needed. All questions at time of visit were answered - patient instructed to contact office with any additional concerns. ER/RTC precautions were reviewed with the patient and understanding verbalized.     Please note: voice recognition software was used to produce this document, and typos may escape review. Please contact Dr. Lyn Hollingshead for any needed clarifications.    Follow up plan: Return in about 1 year (around 06/23/2021) for ANNUAL CHECK-UP - SEE [place] SOONER IF NEEDED.

## 2020-07-08 ENCOUNTER — Encounter: Payer: Self-pay | Admitting: Osteopathic Medicine

## 2020-07-08 ENCOUNTER — Ambulatory Visit (INDEPENDENT_AMBULATORY_CARE_PROVIDER_SITE_OTHER): Payer: No Typology Code available for payment source | Admitting: Osteopathic Medicine

## 2020-07-08 ENCOUNTER — Other Ambulatory Visit: Payer: Self-pay

## 2020-07-08 ENCOUNTER — Telehealth: Payer: Self-pay | Admitting: Neurology

## 2020-07-08 ENCOUNTER — Ambulatory Visit (INDEPENDENT_AMBULATORY_CARE_PROVIDER_SITE_OTHER): Payer: Self-pay

## 2020-07-08 VITALS — BP 167/89 | HR 115 | Temp 98.8°F | Resp 20

## 2020-07-08 DIAGNOSIS — T17908A Unspecified foreign body in respiratory tract, part unspecified causing other injury, initial encounter: Secondary | ICD-10-CM

## 2020-07-08 MED ORDER — ALBUTEROL SULFATE HFA 108 (90 BASE) MCG/ACT IN AERS: 2.0000 | INHALATION_SPRAY | Freq: Four times a day (QID) | RESPIRATORY_TRACT | 1 refills | Status: DC | PRN

## 2020-07-08 MED ORDER — AMOXICILLIN-POT CLAVULANATE 875-125 MG PO TABS: 1.0000 | ORAL_TABLET | Freq: Two times a day (BID) | ORAL | 0 refills | Status: DC

## 2020-07-08 NOTE — Progress Notes (Signed)
Dawn Dunlap is a 65 y.o. female who presents to  [facility] at [facility]  today, 07/08/20, seeking care for the following:  . Choking/aspiration episode last night - woke from sleep shocking, felt bile tase in mouth, thinks she breathed this in. Ate large meal that evening, no EtOH, hx GERD, she has had similar reflux type symptoms before but not this severe. . Able to swallow liquids/solids without issue. Concern for chest pain in lateral ribs d/t coughing. No substernal or epigastric pain. No hemoptysis.      ASSESSMENT & PLAN with other pertinent findings:  The encounter diagnosis was Aspiration into airway, initial encounter.   On exam, R lung WNL, L lung gurgling sounds on expiration in all fields concerning for significant mucus in airway, tho radiolucent object seen on XR but sounds like this was liquid rather than solid. Patient is leaving tomorrow for [place]. No SOB. No CP. I advised ideally we would want close followup and would consider pulmonary referral to assess for bronchoscopy if no improvement. Reasonable to treat w/ antibiotics but this may not do much to prevent pneumonia. She will be in area w/ access to medical care, precautions have been reviewed w/ patient.   There are no Patient Instructions on file for this visit.  Orders Placed This Encounter  Procedures  . DG Chest 2 View  . DG Neck Soft Tissue   Images personally reviewed for XR neck and CXR   DG Neck Soft Tissue  Result Date: 07/08/2020 CLINICAL DATA:  Possible aspiration last night.  Chest pain. EXAM: NECK SOFT TISSUES - 1+ VIEW COMPARISON:  None. FINDINGS: There is no evidence of retropharyngeal soft tissue swelling or epiglottic enlargement. The cervical airway is unremarkable and no radio-opaque foreign body identified. Degenerative disc disease C5-6 and C6-7. IMPRESSION: Normal appearing soft tissues of the neck. C5-7 degenerative disc disease.  Electronically Signed   By: Drusilla Kanner M.D.   On: 07/08/2020 12:06   DG Chest 2 View  Result Date: 07/08/2020 CLINICAL DATA:  Cough, aspiration EXAM: CHEST - 2 VIEW COMPARISON:  None. FINDINGS: The heart size and mediastinal contours are within normal limits. Subtle streaky interstitial opacity within the right lung base. Lungs are otherwise clear. No pleural effusion or pneumothorax. The visualized skeletal structures are unremarkable. IMPRESSION: Subtle streaky interstitial opacity within the right lung base, could reflect atelectasis, aspiration, or developing infiltrate. Electronically Signed   By: Duanne Guess D.O.   On: 07/08/2020 12:10    No orders of the defined types were placed in this encounter.    See below for relevant physical exam findings  See below for recent lab and imaging results reviewed  Medications, allergies, PMH, PSH, SocH, FamH reviewed below    Follow-up instructions: Return if symptoms worsen or fail to improve.                                        Exam:  BP (!) 167/89   Pulse (!) 115   Temp 98.8 F (37.1 C) (Oral)   Resp 20   SpO2 95%   Constitutional: VS see above. General Appearance: alert, well-developed, well-nourished, NAD  Neck: No masses, trachea midline.   Respiratory: Normal respiratory effort. no wheeze, (+) rhonchi on L, no rales  Cardiovascular: S1/S2 normal, no murmur, no rub/gallop auscultated. RRR.   Musculoskeletal: Gait normal. Symmetric and  independent movement of all extremities  Abdominal: non-tender, non-distended, no appreciable organomegaly, neg Murphy's, BS WNLx4  Neurological: Normal balance/coordination. No tremor.  Skin: warm, dry, intact.   Psychiatric: Normal judgment/insight. Normal mood and affect. Oriented x3.   No outpatient medications have been marked as taking for the 07/08/20 encounter (Office Visit) with Sunnie Nielsen, DO.    Allergies  Allergen  Reactions  . Kiwi Extract Itching    Throats itches    Patient Active Problem List   Diagnosis Date Noted  . Hyperplastic polyp of sigmoid colon 10/10/2016  . GERD (gastroesophageal reflux disease) 06/20/2016  . Hyperlipidemia 06/20/2016  . Irritable bowel syndrome with diarrhea 06/20/2016    Family History  Problem Relation Age of Onset  . Dementia Mother     Social History   Tobacco Use  Smoking Status Former Smoker  . Types: E-cigarettes  . Quit date: 05/22/1990  . Years since quitting: 30.1  Smokeless Tobacco Never Used    Past Surgical History:  Procedure Laterality Date  . COLONOSCOPY  2012  . LASER LAPAROSCOPY     X2  . PELVIC LAPAROSCOPY  1991/1992  . 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 TONSILLECTOMY      Immunization History  Administered Date(s) Administered  . Influenza Split 01/23/2012  . Influenza, Seasonal, Injecte, Preservative Fre 02/17/2014, 04/19/2015  . Influenza,inj,Quad PF,6+ Mos 02/23/2018  . Influenza-Unspecified 03/05/2013, 04/05/2016, 03/22/2017, 02/19/2019, 04/21/2020  . PFIZER(Purple Top)SARS-COV-2 Vaccination 08/06/2019, 08/27/2019, 04/21/2020  . Pneumococcal Conjugate-13 04/19/2015  . Pneumococcal Polysaccharide-23 02/19/2014  . Tdap 08/24/2014    Recent Results (from the past 2160 hour(s))  Lipid Panel w/reflex Direct LDL     Status: Abnormal   Collection Time: 06/17/20 12:00 AM  Result Value Ref Range   Cholesterol 209 (H) <200 mg/dL   HDL 67 > OR = 50 mg/dL   Triglycerides 016 (H) <150 mg/dL   LDL Cholesterol (Calc) 112 (H) mg/dL (calc)    Comment: Reference range: <100 . Desirable range <100 mg/dL for primary prevention;   <70 mg/dL for patients with CHD or diabetic patients  with > or = 2 CHD risk factors. [name] LDL-C is now calculated using the Martin-Hopkins  calculation, which is a validated novel method providing  better accuracy than the Friedewald equation in the  estimation of LDL-C.  Horald Pollen et al. Lenox Ahr.  0109;323(55): 2061-2068  (http://education.[facility].com/faq/FAQ164)    Total CHOL/HDL Ratio 3.1 <5.0 (calc)   Non-HDL Cholesterol (Calc) 142 (H) <130 mg/dL (calc)    Comment: For patients with diabetes plus 1 major ASCVD risk  factor, treating to a non-HDL-C goal of <100 mg/dL  (LDL-C of <73 mg/dL) is considered a therapeutic  option.   COMPLETE METABOLIC PANEL WITH GFR     Status: None   Collection Time: 06/17/20 12:00 AM  Result Value Ref Range   Glucose, Bld 86 65 - 99 mg/dL    Comment: .            Fasting reference interval .    BUN 12 7 - 25 mg/dL   Creat 2.20 2.54 - 2.70 mg/dL    Comment: For patients >70 years of age, the reference limit for Creatinine is approximately 13% higher for people identified as African-American. .    GFR, Est Non African American 93 > OR = 60 mL/min/1.26m2   GFR, Est African American 107 > OR = 60 mL/min/1.81m2   BUN/Creatinine Ratio NOT APPLICABLE 6 - 22 (calc)   Sodium 138  135 - 146 mmol/L   Potassium 4.4 3.5 - 5.3 mmol/L   Chloride 101 98 - 110 mmol/L   CO2 29 20 - 32 mmol/L   Calcium 9.5 8.6 - 10.4 mg/dL   Total Protein 6.6 6.1 - 8.1 g/dL   Albumin 4.2 3.6 - 5.1 g/dL   Globulin 2.4 1.9 - 3.7 g/dL (calc)   AG Ratio 1.8 1.0 - 2.5 (calc)   Total Bilirubin 0.4 0.2 - 1.2 mg/dL   Alkaline phosphatase (APISO) 86 37 - 153 U/L   AST 13 10 - 35 U/L   ALT 14 6 - 29 U/L       All questions at time of visit were answered - patient instructed to contact office with any additional concerns or updates. ER/RTC precautions were reviewed with the patient as applicable.   Please note: manual typing as well as voice recognition software may have been used to produce this document - typos may escape review. Please contact Dr. Lyn Hollingshead for any needed clarifications.

## 2020-07-08 NOTE — Telephone Encounter (Signed)
Appt made with provider.

## 2020-07-08 NOTE — Telephone Encounter (Signed)
Patient called into triage this AM.  She has a history of GERD and takes Omeprazole 20 mg daily.  No missed doses.  She aspirated last night on cookies and now having chest pain/coughing.  She wanted to be seen today.  She is able to get down liquids/food this morning, but having ongoing pain in chest.  She leaves for [place] tomorrow and is concerned about traveling.  Please advise.

## 2020-07-08 NOTE — Telephone Encounter (Signed)
Can put her in my last slot of AM or she can see the NP, otherwise would go to urgent care

## 2020-07-29 ENCOUNTER — Encounter: Payer: Self-pay | Admitting: Osteopathic Medicine

## 2020-08-13 ENCOUNTER — Encounter: Payer: Self-pay | Admitting: Osteopathic Medicine

## 2020-09-16 IMAGING — MG DIGITAL SCREENING BILATERAL MAMMOGRAM WITH TOMO AND CAD
8 series · 8 of 24 positions shown · non-contrast
Comparison: Previous exam(s).

CLINICAL DATA: Screening.

EXAM:
DIGITAL SCREENING BILATERAL MAMMOGRAM WITH TOMO AND CAD

[L MLO synth-2D]
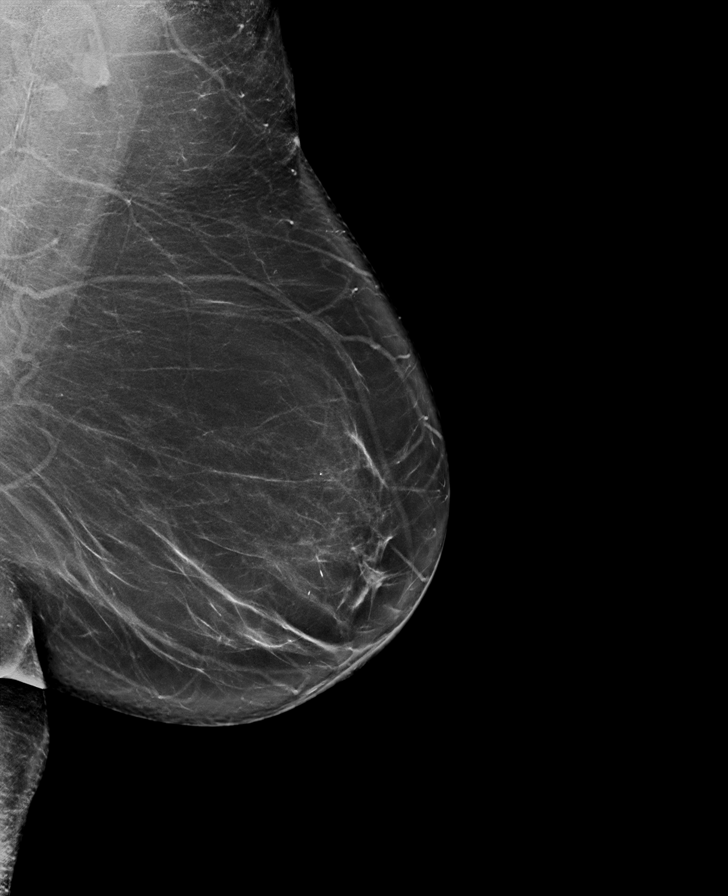

[L CC synth-2D]
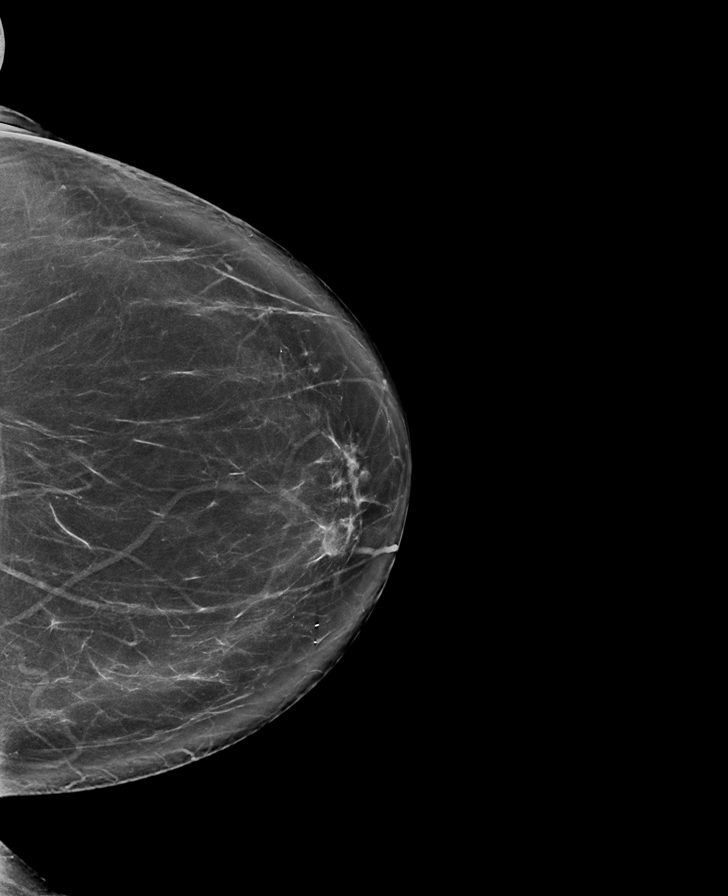

[R CC synth-2D]
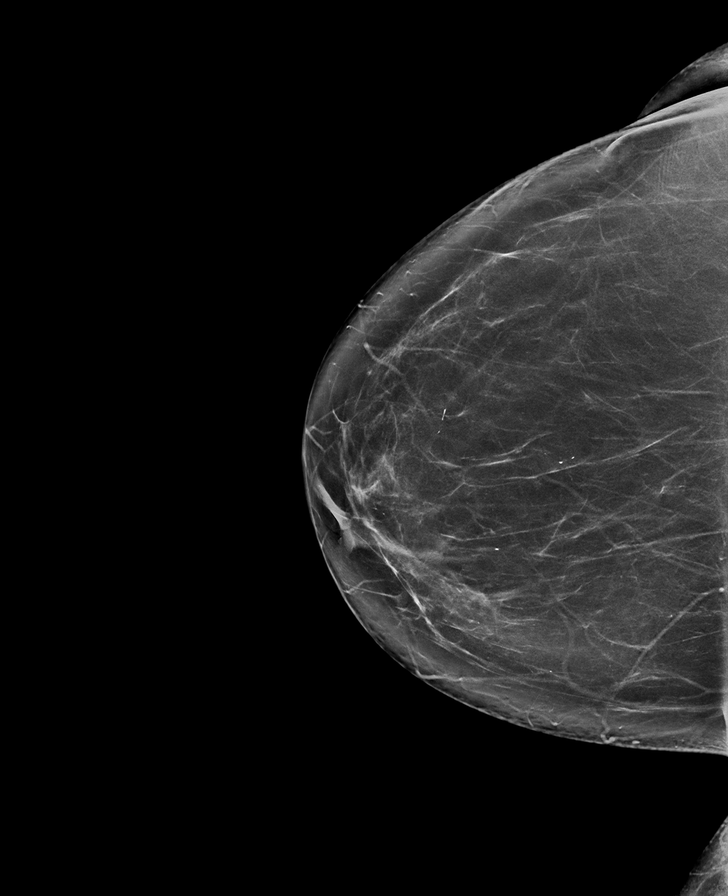

[R MLO synth-2D]
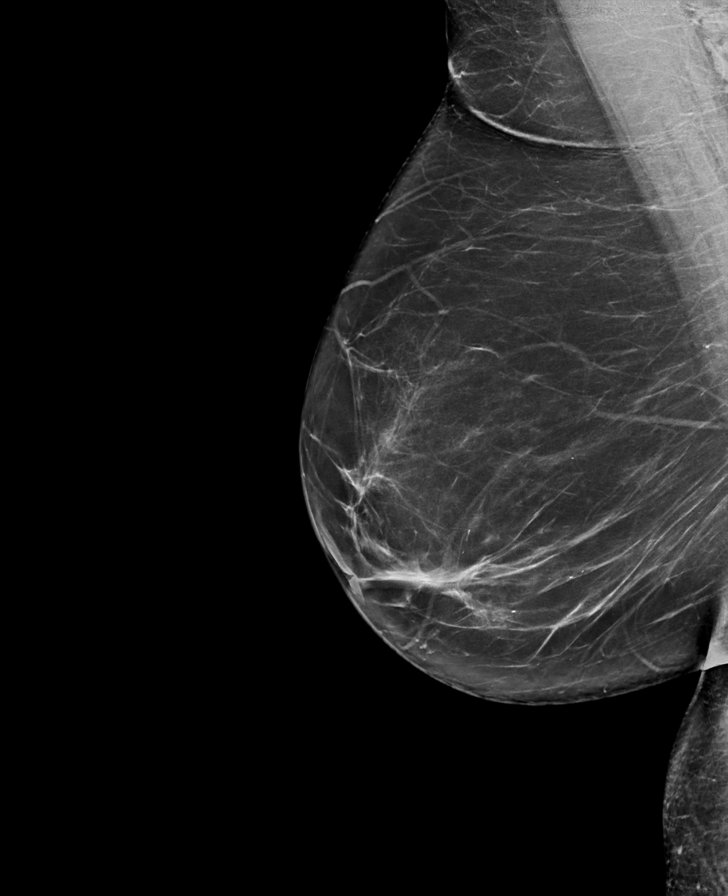

[R CC tomo · tomo slice 44/87.0]
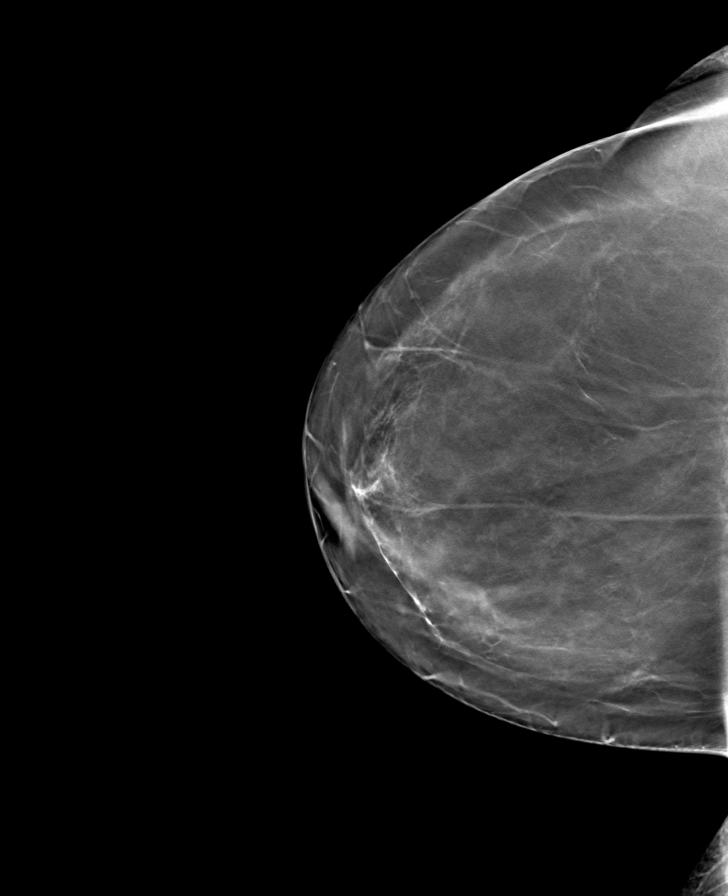

[L CC tomo · tomo slice 43/86.0]
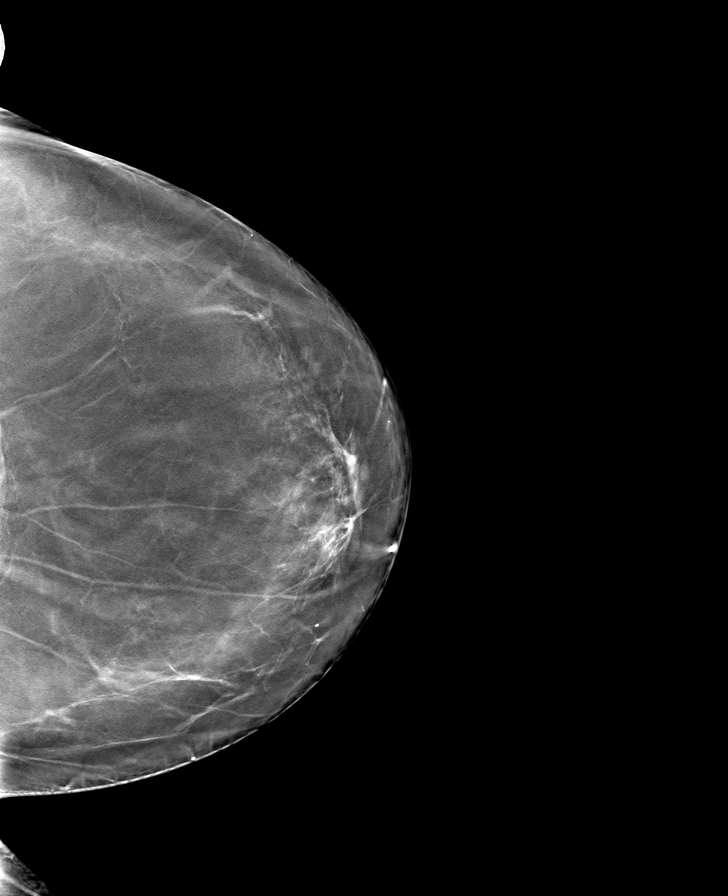

[R MLO tomo · tomo slice 43/85.0]
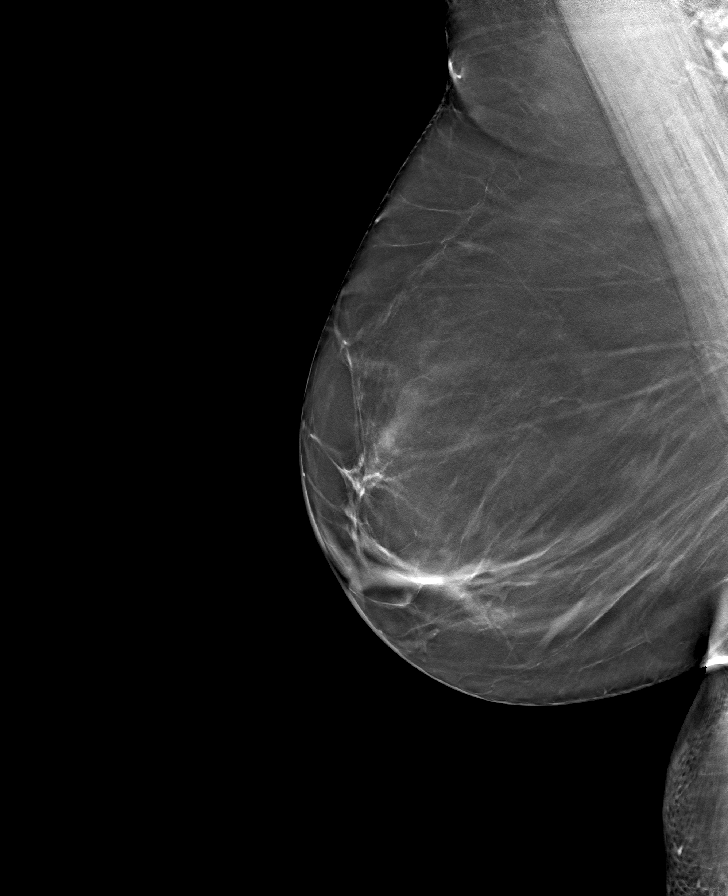

[L MLO tomo · tomo slice 47/93.0]
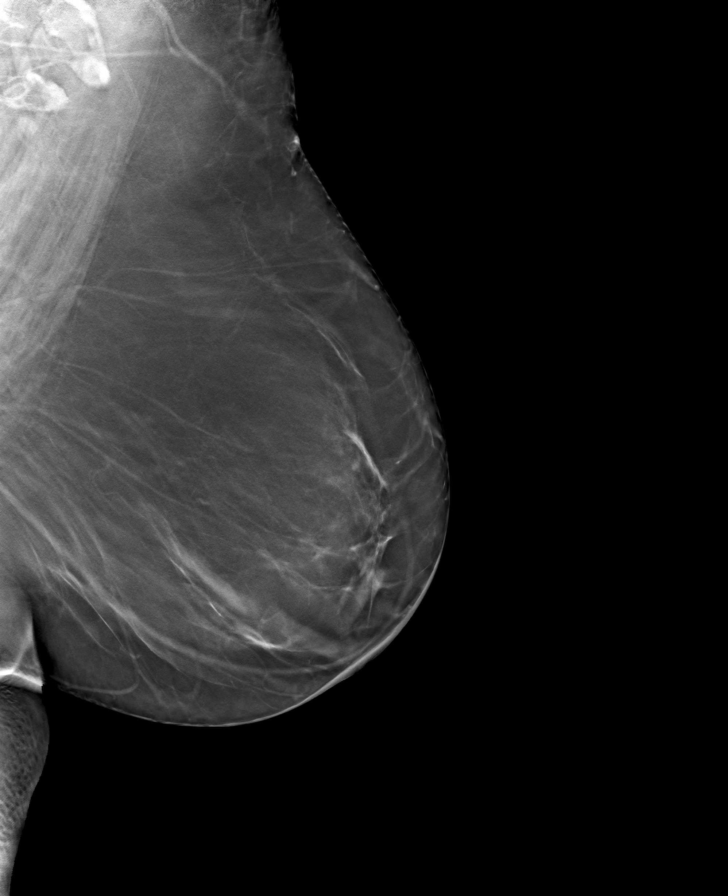

[8 of 24 positions shown; findings below may reference images not displayed]

ACR Breast Density Category b: There are scattered areas of
fibroglandular density.
FINDINGS: There are no findings suspicious for malignancy. Images were
processed with CAD.
IMPRESSION: No mammographic evidence of malignancy. A result letter of this
screening mammogram will be mailed directly to the patient.

RECOMMENDATION:
Screening mammogram in one year. (Code:[id number])

BI-RADS CATEGORY  1: Negative.

## 2021-07-21 ENCOUNTER — Telehealth: Payer: Self-pay | Admitting: Osteopathic Medicine

## 2021-07-21 DIAGNOSIS — E7849 Other hyperlipidemia: Secondary | ICD-10-CM

## 2021-07-21 DIAGNOSIS — F419 Anxiety disorder, unspecified: Secondary | ICD-10-CM

## 2021-07-21 MED ORDER — OMEPRAZOLE 20 MG PO CPDR: 20.0000 mg | DELAYED_RELEASE_CAPSULE | Freq: Every day | ORAL | 0 refills | Status: DC

## 2021-07-21 MED ORDER — BUPROPION HCL ER (XL) 150 MG PO TB24: 150.0000 mg | ORAL_TABLET | Freq: Every day | ORAL | 0 refills | Status: DC

## 2021-07-21 MED ORDER — SIMVASTATIN 40 MG PO TABS
20.0000 mg | ORAL_TABLET | Freq: Every day | ORAL | 0 refills | Status: DC
Start: 2021-07-21 — End: 2021-08-25

## 2021-07-21 NOTE — Telephone Encounter (Signed)
Patient called and was a patient of Dr.Alexander and needs a refill on Simvastatin, Wellbutrin, and Omeprazole sent to [facility]. She has scheduled appt with New Pcp for May ?

## 2021-07-21 NOTE — Telephone Encounter (Signed)
Sent!

## 2021-08-25 ENCOUNTER — Other Ambulatory Visit: Payer: Self-pay | Admitting: Osteopathic Medicine

## 2021-08-25 DIAGNOSIS — E7849 Other hyperlipidemia: Secondary | ICD-10-CM

## 2022-04-11 ENCOUNTER — Encounter: Payer: Self-pay | Admitting: Physician Assistant

## 2022-04-11 ENCOUNTER — Ambulatory Visit (INDEPENDENT_AMBULATORY_CARE_PROVIDER_SITE_OTHER): Payer: Medicare HMO | Admitting: Physician Assistant

## 2022-04-11 VITALS — BP 134/71 | HR 88 | Ht 60.0 in | Wt 168.0 lb

## 2022-04-11 DIAGNOSIS — N6322 Unspecified lump in the left breast, upper inner quadrant: Secondary | ICD-10-CM | POA: Diagnosis not present

## 2022-04-11 DIAGNOSIS — R21 Rash and other nonspecific skin eruption: Secondary | ICD-10-CM

## 2022-04-11 MED ORDER — CLOBETASOL PROPIONATE 0.05 % EX CREA: 1.0000 | TOPICAL_CREAM | Freq: Two times a day (BID) | CUTANEOUS | 1 refills | Status: AC

## 2022-04-11 MED ORDER — HYDROXYZINE HCL 10 MG PO TABS: 10.0000 mg | ORAL_TABLET | Freq: Three times a day (TID) | ORAL | 0 refills | Status: DC | PRN

## 2022-04-11 NOTE — Progress Notes (Signed)
   Acute Office Visit  Subjective:     Patient ID: Dawn Dunlap, female    DOB: Dec 18, 1955, 66 y.o.   MRN: [id number]  Chief Complaint  Patient presents with   Breast Mass    HPI Patient is in today for mass palpated of left breast for the last 2 weeks. She noticed the mass and made the appt and has not touched them again. She denies any history of breast masses. She spends half of her time in [place] and had her last mammogram 12/2021 and was normal. Her maternal and paternal aunts have had breast cancer. She denies any nipple discharge her nipples have always been inverted.   She has had an intermittent rash of legs and buttocks that is itching. Triamcinolone helps some. No trigger or link to lotions or detergent. Not sure if linked to stress. Her mother had stress urticaria.    .. Active Ambulatory Problems    Diagnosis Date Noted   GERD (gastroesophageal reflux disease) 06/20/2016   Hyperlipidemia 06/20/2016   Irritable bowel syndrome with diarrhea 06/20/2016   Hyperplastic polyp of sigmoid colon 10/10/2016   Mass of upper inner quadrant of left breast 04/11/2022   Resolved Ambulatory Problems    Diagnosis Date Noted   No Resolved Ambulatory Problems   No Additional Past Medical History     ROS  See HPI.     Objective:    BP 134/71   Pulse 88   Ht 5' (1.524 m)   Wt 168 lb (76.2 kg)   SpO2 97%   BMI 32.81 kg/m  BP Readings from Last 3 Encounters:  04/11/22 134/71  07/08/20 (!) 167/89  06/23/20 130/85   Wt Readings from Last 3 Encounters:  04/11/22 168 lb (76.2 kg)  06/23/20 165 lb 0.6 oz (74.9 kg)  06/23/19 173 lb 0.6 oz (78.5 kg)      Physical Exam Chest:       Comments: 2 pea sized firm but mobile mass, non tender at 9 o'clock 1 pea sized firm but mobile mass, non tender at 12 o'clock No erythema, warmth or swelling Bilateral inverted nipples Skin:    Findings: Rash present.     Comments: Slightly raised erythematous rash diffusely on left  buttocks.   Neurological:     General: No focal deficit present.     Mental Status: She is oriented to person, place, and time.  Psychiatric:        Mood and Affect: Mood normal.          Assessment & Plan:  .Marland KitchenRotha was seen today for breast mass.  Diagnoses and all orders for this visit:  Mass of upper inner quadrant of left breast -     MM DIAG BREAST TOMO BILATERAL; Future -     US BREAST COMPLETE UNI LEFT INC AXILLA  Rash and nonspecific skin eruption -     clobetasol cream (TEMOVATE) 0.05 %; Apply 1 Application topically 2 (two) times daily. -     hydrOXYzine (ATARAX) 10 MG tablet; Take 1 tablet (10 mg total) by mouth 3 (three) times daily as needed for itching.   Needs diagnostic mammogram and u/s, ordered today.   Discussed rash, appears to be contact related or even stress urticaria.  Topical steroid given and hydroxyzine as needed for itching and rash. Follow up as needed   Tandy Gaw, PA-C

## 2022-04-11 NOTE — Patient Instructions (Signed)
Will get breast imaging Topical steroid for rash Hydroxyzine for itching

## 2022-05-01 ENCOUNTER — Encounter: Payer: Self-pay | Admitting: Physician Assistant

## 2022-05-17 ENCOUNTER — Ambulatory Visit
Admission: RE | Admit: 2022-05-17 | Discharge: 2022-05-17 | Disposition: A | Discharge status: Home / Self Care | Payer: No Typology Code available for payment source | Source: Ambulatory Visit | Attending: Physician Assistant | Admitting: Physician Assistant

## 2022-05-17 ENCOUNTER — Ambulatory Visit: Admission: RE | Admit: 2022-05-17 | Payer: No Typology Code available for payment source | Source: Ambulatory Visit

## 2022-05-17 ENCOUNTER — Ambulatory Visit
Admission: RE | Admit: 2022-05-17 | Discharge: 2022-05-17 | Disposition: A | Discharge status: Home / Self Care | Payer: Medicare HMO | Source: Ambulatory Visit | Attending: Physician Assistant | Admitting: Physician Assistant

## 2022-05-17 ENCOUNTER — Other Ambulatory Visit: Payer: Self-pay | Admitting: Physician Assistant

## 2022-05-17 DIAGNOSIS — N6322 Unspecified lump in the left breast, upper inner quadrant: Secondary | ICD-10-CM

## 2022-05-17 DIAGNOSIS — R21 Rash and other nonspecific skin eruption: Secondary | ICD-10-CM

## 2022-05-17 NOTE — Progress Notes (Signed)
No mammographic findings. Screening mammogram in one year.

## 2022-07-05 ENCOUNTER — Ambulatory Visit (INDEPENDENT_AMBULATORY_CARE_PROVIDER_SITE_OTHER): Payer: No Typology Code available for payment source | Admitting: Physician Assistant

## 2022-07-05 ENCOUNTER — Encounter: Payer: Self-pay | Admitting: Physician Assistant

## 2022-07-05 VITALS — BP 154/69 | HR 101 | Ht 60.0 in | Wt 174.0 lb

## 2022-07-05 DIAGNOSIS — K219 Gastro-esophageal reflux disease without esophagitis: Secondary | ICD-10-CM

## 2022-07-05 DIAGNOSIS — R1013 Epigastric pain: Secondary | ICD-10-CM | POA: Diagnosis not present

## 2022-07-05 DIAGNOSIS — K449 Diaphragmatic hernia without obstruction or gangrene: Secondary | ICD-10-CM

## 2022-07-05 DIAGNOSIS — R1011 Right upper quadrant pain: Secondary | ICD-10-CM

## 2022-07-05 DIAGNOSIS — R030 Elevated blood-pressure reading, without diagnosis of hypertension: Secondary | ICD-10-CM

## 2022-07-05 MED ORDER — OMEPRAZOLE 40 MG PO CPDR: 40.0000 mg | DELAYED_RELEASE_CAPSULE | Freq: Every day | ORAL | 3 refills | Status: DC

## 2022-07-05 NOTE — Progress Notes (Signed)
Acute Office Visit  Subjective:     Patient ID: Dawn Dunlap, female    DOB: 06-18-1955, 67 y.o.   MRN: [id number]  Chief Complaint  Patient presents with   Abdominal Pain    HPI Patient is in today for epigastric and right upper quadrant pain that has been off and on for 20 years. Pt has PMH of GERD, hiatal hernia, IBS-D. She is on omeprazole 27m daily and frequent gavascion usage. [place]. Follow up in 3 years due to polyps. Denies melena or hematochezia. She still has her gallbladder. Food does seem to make worse but not noticed any correlations at this point. Never had CT or U/S.  No CP or SOB.   Past Surgical History:  Procedure Laterality Date   COLONOSCOPY  2012   LASER LAPAROSCOPY     X2   PELVIC LAPAROSCOPY  1991/1992   SKIN CANCER DESTRUCTION  2008   NECK   TONSILLECTOMY       ROS  See HPI.     Objective:    BP (!) 154/69 (BP Location: Right Arm, Patient Position: Sitting, Cuff Size: Small)   Pulse (!) 101   Ht 5' (1.524 m)   Wt 174 lb 0.6 oz (78.9 kg)   SpO2 96%   BMI 33.99 kg/m  BP Readings from Last 3 Encounters:  07/05/22 (!) 154/69  04/11/22 134/71  07/08/20 (!) 167/89   Wt Readings from Last 3 Encounters:  07/05/22 174 lb 0.6 oz (78.9 kg)  04/11/22 168 lb (76.2 kg)  06/23/20 165 lb 0.6 oz (74.9 kg)      Physical Exam Vitals reviewed.  Constitutional:      Appearance: She is well-developed.  HENT:     Head: Normocephalic.  Cardiovascular:     Rate and Rhythm: Normal rate and regular rhythm.  Pulmonary:     Effort: Pulmonary effort is normal.     Breath sounds: Normal breath sounds.  Abdominal:     General: Bowel sounds are normal.     Palpations: Abdomen is soft.     Tenderness: There is abdominal tenderness in the right upper quadrant and epigastric area. There is no right CVA tenderness, left CVA tenderness, guarding or rebound. Negative signs include Murphy's sign, Rovsing's sign, McBurney's  sign, psoas sign and obturator sign.     Hernia: No hernia is present.  Neurological:     General: No focal deficit present.     Mental Status: She is alert.  Psychiatric:        Mood and Affect: Mood normal.          Assessment & Plan:  ..Marland KitchenMateowas seen today for abdominal pain.  Diagnoses and all orders for this visit:  Epigastric pain -     COMPLETE METABOLIC PANEL WITH GFR -     CBC w/Diff/Platelet -     omeprazole (PRILOSEC) 40 MG capsule; Take 1 capsule (40 mg total) by mouth daily. -     Lipase -     [place]dom[place] Complete; Future  Right upper quadrant pain -     omeprazole (PRILOSEC) 40 MG capsule; Take 1 capsule (40 mg total) by mouth daily. -     [place]dom[place] Complete; Future  Hiatal hernia -     omeprazole (PRILOSEC) 40 MG capsule; Take 1 capsule (40 mg total) by mouth daily. -     [place]dom[place] Complete; Future  Gastroesophageal reflux disease, unspecified whether esophagitis present -  omeprazole (PRILOSEC) 40 MG capsule; Take 1 capsule (40 mg total) by mouth daily.  Elevated blood pressure reading   Unclear etiology of epigastric pain gallstones vs hiatal hernia vs gastritis due to GERD  Increase omeprazole to 35m Will get abdominal ultrasound Will check lipase, cmp, cbc BP is elevated will need to watch this Follow up in 4 weeks  JIran Planas PA-C

## 2022-07-05 NOTE — Patient Instructions (Signed)
Hiatal Hernia  A hiatal hernia occurs when part of the stomach slides above the muscle that separates the abdomen from the chest (diaphragm). A person can be born with a hiatal hernia (congenital), or it may develop over time. In almost all cases of hiatal hernia, only the top part of the stomach pushes through the diaphragm. Many people have a hiatal hernia with no symptoms. The larger the hernia, the more likely it is that you will have symptoms. In some cases, a hiatal hernia allows stomach acid to flow back into the tube that carries food from your mouth to your stomach (esophagus). This may cause heartburn symptoms. The development of heartburn symptoms may mean that you have a condition called gastroesophageal reflux disease (GERD). What are the causes? This condition is caused by a weakness in the opening (hiatus) where the esophagus passes through the diaphragm to attach to the upper part of the stomach. A person may be born with a weakness in the hiatus, or a weakness can develop over time. What increases the risk? This condition is more likely to develop in: Older people. Age is a major risk factor for a hiatal hernia, especially if you are over the age of 33. Pregnant women. People who are overweight. People who have frequent constipation. What are the signs or symptoms? Symptoms of this condition usually develop in the form of GERD symptoms. Symptoms include: Heartburn. Upset stomach (indigestion). Trouble swallowing. Coughing or wheezing. Wheezing is making high-pitched whistling sounds when you breathe. Sore throat. Chest pain. Nausea and vomiting. How is this diagnosed? This condition may be diagnosed during testing for GERD. Tests that may be done include: X-rays of your stomach or chest. An upper gastrointestinal (GI) series. This is an X-ray exam of your GI tract that is taken after you swallow a chalky liquid that shows up clearly on the X-ray. Endoscopy. This is a  procedure to look into your stomach using a thin, flexible tube that has a tiny camera and light on the end of it. How is this treated? This condition may be treated by: Dietary and lifestyle changes to help reduce GERD symptoms. Medicines. These may include: Over-the-counter antacids. Medicines that make your stomach empty more quickly. Medicines that block the production of stomach acid (H2 blockers). Stronger medicines to reduce stomach acid (proton pump inhibitors). Surgery to repair the hernia, if other treatments are not helping. If you have no symptoms, you may not need treatment. Follow these instructions at home: Lifestyle and activity Do not use any products that contain nicotine or tobacco. These products include cigarettes, chewing tobacco, and vaping devices, such as e-cigarettes. If you need help quitting, ask your health care provider. Try to achieve and maintain a healthy body weight. Avoid putting pressure on your abdomen. Anything that puts pressure on your abdomen increases the amount of acid that may be pushed up into your esophagus. Avoid bending over, especially after eating. Raise the head of your bed by putting blocks under the legs. This keeps your head and esophagus higher than your stomach. Do not wear tight clothing around your chest or stomach. Try not to strain when having a bowel movement, when urinating, or when lifting heavy objects. Eating and drinking Avoid foods that can worsen GERD symptoms. These may include: Fatty foods, like fried foods. Citrus fruits, like oranges or lemon. Other foods and drinks that contain acid, like orange juice or tomatoes. Spicy food. Chocolate. Eat frequent small meals instead of three large meals a  day. This helps prevent your stomach from getting too full. Eat slowly. Do not lie down right after eating. Do not eat 1-2 hours before bed. Do not drink beverages with caffeine. These include cola, coffee, cocoa, and tea. Do  not drink alcohol. General instructions Take over-the-counter and prescription medicines only as told by your health care provider. Keep all follow-up visits. Your health care provider will want to check that any new prescribed medicines are helping your symptoms. Contact a health care provider if: Your symptoms are not controlled with medicines or lifestyle changes. You are having trouble swallowing. You have coughing or wheezing that will not go away. Your pain is getting worse. Your pain spreads to your arms, neck, jaw, teeth, or back. You feel nauseous or you vomit. Get help right away if: You have shortness of breath. You vomit blood. You have bright red blood in your stools. You have black, tarry stools. These symptoms may be an emergency. Get help right away. Call 911. Do not wait to see if the symptoms will go away. Do not drive yourself to the hospital. Summary A hiatal hernia occurs when part of the stomach slides above the muscle that separates the abdomen from the chest. A person may be born with a weakness in the hiatus, or a weakness can develop over time. Symptoms of a hiatal hernia may include heartburn, trouble swallowing, or sore throat. Management of a hiatal hernia includes eating frequent small meals instead of three large meals a day. Get help right away if you vomit blood, have bright red blood in your stools, or have black, tarry stools. This information is not intended to replace advice given to you by your health care provider. Make sure you discuss any questions you have with your health care provider. Document Revised: 07/05/2021 Document Reviewed: 07/05/2021 Elsevier Patient Education  [place].

## 2022-07-06 LAB — CBC WITH DIFFERENTIAL/PLATELET
Absolute Monocytes: 756 cells/uL (ref 200–950)
Basophils Absolute: 54 cells/uL (ref 0–200)
Basophils Relative: 0.6 %
Eosinophils Absolute: 621 cells/uL — ABNORMAL HIGH (ref 15–500)
Eosinophils Relative: 6.9 %
HCT: 34.1 % — ABNORMAL LOW (ref 35.0–45.0)
Hemoglobin: 11.2 g/dL — ABNORMAL LOW (ref 11.7–15.5)
Lymphs Abs: 2412 cells/uL (ref 850–3900)
MCH: 30.3 pg (ref 27.0–33.0)
MCHC: 32.8 g/dL (ref 32.0–36.0)
MCV: 92.2 fL (ref 80.0–100.0)
MPV: 10.4 fL (ref 7.5–12.5)
Monocytes Relative: 8.4 %
Neutro Abs: 5157 cells/uL (ref 1500–7800)
Neutrophils Relative %: 57.3 %
Platelets: 393 10*3/uL (ref 140–400)
RBC: 3.7 10*6/uL — ABNORMAL LOW (ref 3.80–5.10)
RDW: 12.6 % (ref 11.0–15.0)
Total Lymphocyte: 26.8 %
WBC: 9 10*3/uL (ref 3.8–10.8)

## 2022-07-06 LAB — COMPLETE METABOLIC PANEL WITH GFR
AG Ratio: 1.5 (calc) (ref 1.0–2.5)
ALT: 12 U/L (ref 6–29)
AST: 15 U/L (ref 10–35)
Albumin: 4.3 g/dL (ref 3.6–5.1)
Alkaline phosphatase (APISO): 93 U/L (ref 37–153)
BUN: 13 mg/dL (ref 7–25)
CO2: 27 mmol/L (ref 20–32)
Calcium: 10 mg/dL (ref 8.6–10.4)
Chloride: 105 mmol/L (ref 98–110)
Creat: 0.81 mg/dL (ref 0.50–1.05)
Globulin: 2.9 g/dL (calc) (ref 1.9–3.7)
Glucose, Bld: 93 mg/dL (ref 65–99)
Potassium: 5 mmol/L (ref 3.5–5.3)
Sodium: 143 mmol/L (ref 135–146)
Total Bilirubin: 0.2 mg/dL (ref 0.2–1.2)
Total Protein: 7.2 g/dL (ref 6.1–8.1)
eGFR: 81 mL/min/{1.73_m2} (ref >=60)

## 2022-07-06 LAB — LIPASE: Lipase: 31 U/L (ref 7–60)

## 2022-07-07 ENCOUNTER — Encounter: Payer: Self-pay | Admitting: Physician Assistant

## 2022-07-07 DIAGNOSIS — R030 Elevated blood-pressure reading, without diagnosis of hypertension: Secondary | ICD-10-CM | POA: Insufficient documentation

## 2022-07-07 NOTE — Progress Notes (Signed)
Sueanne,   Normal WBC.  Hemoglobin a little low suggesting some anemia. Increase iron rich foods and/or start a supplement daily.  Eosinophils a little elevated usually due to allergies.  Kidney, liver, glucose looks great.  Lipase normal.

## 2022-07-11 ENCOUNTER — Ambulatory Visit (INDEPENDENT_AMBULATORY_CARE_PROVIDER_SITE_OTHER): Payer: Medicare HMO

## 2022-07-11 DIAGNOSIS — R1011 Right upper quadrant pain: Secondary | ICD-10-CM | POA: Diagnosis not present

## 2022-07-11 DIAGNOSIS — R1013 Epigastric pain: Secondary | ICD-10-CM | POA: Diagnosis not present

## 2022-07-11 DIAGNOSIS — K449 Diaphragmatic hernia without obstruction or gangrene: Secondary | ICD-10-CM

## 2022-07-12 ENCOUNTER — Other Ambulatory Visit: Payer: Self-pay | Admitting: Physician Assistant

## 2022-07-12 ENCOUNTER — Encounter: Payer: Self-pay | Admitting: Physician Assistant

## 2022-07-12 DIAGNOSIS — K449 Diaphragmatic hernia without obstruction or gangrene: Secondary | ICD-10-CM

## 2022-07-12 DIAGNOSIS — K219 Gastro-esophageal reflux disease without esophagitis: Secondary | ICD-10-CM

## 2022-07-12 DIAGNOSIS — R1011 Right upper quadrant pain: Secondary | ICD-10-CM

## 2022-07-12 DIAGNOSIS — K838 Other specified diseases of biliary tract: Secondary | ICD-10-CM

## 2022-07-12 DIAGNOSIS — K8020 Calculus of gallbladder without cholecystitis without obstruction: Secondary | ICD-10-CM

## 2022-07-12 DIAGNOSIS — R1013 Epigastric pain: Secondary | ICD-10-CM

## 2022-07-12 NOTE — Progress Notes (Signed)
U/s shows probable gallstones and common bile duct dilation. We need to get you to GI to further work up. It could be gallstone causing the bile duct enlargement.

## 2022-09-07 ENCOUNTER — Encounter: Payer: Self-pay | Admitting: Physician Assistant

## 2022-09-07 DIAGNOSIS — F419 Anxiety disorder, unspecified: Secondary | ICD-10-CM

## 2022-09-07 DIAGNOSIS — E7849 Other hyperlipidemia: Secondary | ICD-10-CM

## 2022-09-08 MED ORDER — SIMVASTATIN 40 MG PO TABS: 20.0000 mg | ORAL_TABLET | Freq: Every day | ORAL | 0 refills | Status: DC

## 2022-09-08 MED ORDER — BUPROPION HCL ER (XL) 150 MG PO TB24: 150.0000 mg | ORAL_TABLET | Freq: Every day | ORAL | 0 refills | Status: DC

## 2022-12-11 ENCOUNTER — Telehealth: Payer: Self-pay | Admitting: Physician Assistant

## 2022-12-12 NOTE — Telephone Encounter (Signed)
Patient decided to schedule appt

## 2022-12-13 ENCOUNTER — Telehealth: Payer: Self-pay

## 2022-12-13 NOTE — Telephone Encounter (Signed)
If we did a virtual and then you tried conservative measures I could likely order and they approve. Your other option is to go straight to sports medicine in [place].

## 2022-12-13 NOTE — Telephone Encounter (Signed)
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 I spoke with Lamar Laundry Morrie Sheldon was on another call) regarding the provider's recommendations. Reece Levy will reach out to the patient with the update. Direct call back info was provided for any additional inquiries.

## 2022-12-13 NOTE — Telephone Encounter (Signed)
Received a call from [facility] located in [place] requesting if provider can order an MRI for the patient. The MRI study is for knee pain. 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 The MRI is to be done in [place]. Please advise, thanks.

## 2022-12-26 DIAGNOSIS — Z0000 Encounter for general adult medical examination without abnormal findings: Secondary | ICD-10-CM | POA: Diagnosis not present

## 2022-12-26 DIAGNOSIS — E785 Hyperlipidemia, unspecified: Secondary | ICD-10-CM | POA: Diagnosis not present

## 2022-12-26 DIAGNOSIS — I10 Essential (primary) hypertension: Secondary | ICD-10-CM | POA: Diagnosis not present

## 2023-01-04 ENCOUNTER — Telehealth: Payer: Self-pay | Admitting: Physician Assistant

## 2023-01-04 NOTE — Telephone Encounter (Signed)
Patient called in for a MRI for right knee. States that she is in [place] and wants it done there. insurance gave the okay for it but needs a visit via video.

## 2023-01-05 ENCOUNTER — Telehealth: Payer: Medicare HMO | Admitting: Physician Assistant

## 2023-01-05 ENCOUNTER — Encounter: Payer: Self-pay | Admitting: Physician Assistant

## 2023-01-05 VITALS — BP 136/85 | Ht 59.0 in | Wt 161.0 lb

## 2023-01-05 DIAGNOSIS — G8929 Other chronic pain: Secondary | ICD-10-CM | POA: Diagnosis not present

## 2023-01-05 DIAGNOSIS — M1711 Unilateral primary osteoarthritis, right knee: Secondary | ICD-10-CM | POA: Diagnosis not present

## 2023-01-05 DIAGNOSIS — M25561 Pain in right knee: Secondary | ICD-10-CM | POA: Diagnosis not present

## 2023-01-05 NOTE — Progress Notes (Unsigned)
Pt needs referral to ortho and mri or right knee pt states  she  she was told she needs to be on b/p medication

## 2023-01-05 NOTE — Progress Notes (Unsigned)
 ..  Virtual Visit via Video Note  I connected with Dawn Dunlap on 01/08/23 at 11:30 AM EDT by a video enabled telemedicine application and verified that I am speaking with the correct person using two identifiers.  Location: Patient: home Provider: clinic   .Marland KitchenParticipating in visit:  Patient: [name] Provider: Tandy Gaw PA-C  I discussed the limitations of evaluation and management by telemedicine and the availability of in person appointments. The patient expressed understanding and agreed to proceed.  History of Present Illness: Pt is a 67 yo female with hx of right knee pain for years with recent worsening pain after playing pickleball. She has fallen once working on the train. She has to wear a knee sleeve for support. She is on her feet for 12-17 hours a day and knee feels like it is going to "give way". Most recent xray did show some minimal arthritis with moderate effusion. She has not had injections or seen orthopedics. NSAIDs do help a lot. She would like referral and orders.   .. Active Ambulatory Problems    Diagnosis Date Noted   GERD (gastroesophageal reflux disease) 06/20/2016   Hyperlipidemia 06/20/2016   Irritable bowel syndrome with diarrhea 06/20/2016   Hyperplastic polyp of sigmoid colon 10/10/2016   Mass of upper inner quadrant of left breast 04/11/2022   Epigastric pain 07/05/2022   Right upper quadrant pain 07/05/2022   Hiatal hernia 07/05/2022   Elevated blood pressure reading 07/07/2022   Gallstones 07/12/2022   Resolved Ambulatory Problems    Diagnosis Date Noted   No Resolved Ambulatory Problems   No Additional Past Medical History    Observations/Objective: No acute distress Normal mood and appearance  .[name] Today's Vitals   01/05/23 1125  BP: 136/85  Weight: 161 lb (73 kg)  Height: 4\' 11"  (1.499 m)   Body mass index is 32.52 kg/m.    Assessment and Plan: Marland KitchenMarland KitchenDiagnoses and all orders for this visit:  Chronic pain of right  knee -     Ambulatory referral to Orthopedic Surgery -     MR KNEE RIGHT WO CONTRAST; Future  Primary osteoarthritis of right knee -     Ambulatory referral to Orthopedic Surgery -     MR KNEE RIGHT WO CONTRAST; Future   Referral to ortho sent MRI ordered  Continue Nsaids, icing, bracing as needed.    Follow Up Instructions:    I discussed the assessment and treatment plan with the patient. The patient was provided an opportunity to ask questions and all were answered. The patient agreed with the plan and demonstrated an understanding of the instructions.   The patient was advised to call back or seek an in-person evaluation if the symptoms worsen or if the condition fails to improve as anticipated.  Tandy Gaw, PA-C

## 2023-01-10 ENCOUNTER — Encounter: Payer: Self-pay | Admitting: Physician Assistant

## 2023-01-10 DIAGNOSIS — G8929 Other chronic pain: Secondary | ICD-10-CM | POA: Insufficient documentation

## 2023-01-10 DIAGNOSIS — M1711 Unilateral primary osteoarthritis, right knee: Secondary | ICD-10-CM | POA: Insufficient documentation

## 2023-01-17 ENCOUNTER — Telehealth: Payer: Self-pay | Admitting: Physician Assistant

## 2023-01-17 NOTE — Telephone Encounter (Signed)
Patient is requesting an MRI to be sent to [facility] [phone] And Orthopedic referral needs to be faxed to [facility] fax [phone]

## 2023-01-17 NOTE — Telephone Encounter (Signed)
Forwarded to tosha [place] and [employer]

## 2023-01-17 NOTE — Telephone Encounter (Signed)
I placed referral for this person a while back??

## 2023-01-18 NOTE — Telephone Encounter (Signed)
Referral, clinical notes, imaging reports, demographics, and copies of insurance cards have been faxed to [facility] at [phone]. Office will contact patient to schedule referral appointment.    Contacted [facility] ( Spoke with Brandone) ph: [phone]- referral needs to be faxed to centralized hub at [phone], once processed. Patient will be contacted to schedule an appointment.

## 2023-01-26 DIAGNOSIS — Z0100 Encounter for examination of eyes and vision without abnormal findings: Secondary | ICD-10-CM | POA: Diagnosis not present

## 2023-02-07 DIAGNOSIS — M1711 Unilateral primary osteoarthritis, right knee: Secondary | ICD-10-CM | POA: Diagnosis not present

## 2023-02-13 DIAGNOSIS — S83411A Sprain of medial collateral ligament of right knee, initial encounter: Secondary | ICD-10-CM | POA: Insufficient documentation

## 2023-02-13 DIAGNOSIS — M7650 Patellar tendinitis, unspecified knee: Secondary | ICD-10-CM | POA: Insufficient documentation

## 2023-02-13 DIAGNOSIS — M1711 Unilateral primary osteoarthritis, right knee: Secondary | ICD-10-CM | POA: Insufficient documentation

## 2023-02-13 DIAGNOSIS — S83281A Other tear of lateral meniscus, current injury, right knee, initial encounter: Secondary | ICD-10-CM | POA: Insufficient documentation

## 2023-02-13 DIAGNOSIS — S83241A Other tear of medial meniscus, current injury, right knee, initial encounter: Secondary | ICD-10-CM | POA: Insufficient documentation

## 2023-02-14 ENCOUNTER — Telehealth: Payer: Self-pay | Admitting: *Deleted

## 2023-02-14 NOTE — Telephone Encounter (Signed)
Dawn Dunlap Diagnostic Report received and provider had note on this stating needs ortho appt and should have? Fax. Contacted [facility] and confirmed that they have been in touch with patient.  She was scheduled on 02/16/23 and it was cancelled due to provider cancelling.  It was then rescheduled for 02/23/2023 and patient cancelled this appointment and it has not been rescheduled as of today. Will placed imaging report in scan to be placed in chart. Routing to PCP as a [place]

## 2023-03-07 NOTE — Telephone Encounter (Signed)
Erroneous error

## 2023-04-13 ENCOUNTER — Other Ambulatory Visit: Payer: Self-pay | Admitting: Physician Assistant

## 2023-04-13 DIAGNOSIS — Z1231 Encounter for screening mammogram for malignant neoplasm of breast: Secondary | ICD-10-CM

## 2023-04-23 ENCOUNTER — Encounter: Payer: Self-pay | Admitting: Physician Assistant

## 2023-04-23 ENCOUNTER — Ambulatory Visit (INDEPENDENT_AMBULATORY_CARE_PROVIDER_SITE_OTHER): Payer: Medicare HMO | Admitting: Physician Assistant

## 2023-04-23 VITALS — BP 138/92 | HR 90 | Ht 59.0 in | Wt 156.0 lb

## 2023-04-23 DIAGNOSIS — M1711 Unilateral primary osteoarthritis, right knee: Secondary | ICD-10-CM | POA: Diagnosis not present

## 2023-04-23 DIAGNOSIS — F419 Anxiety disorder, unspecified: Secondary | ICD-10-CM | POA: Insufficient documentation

## 2023-04-23 DIAGNOSIS — M25562 Pain in left knee: Secondary | ICD-10-CM

## 2023-04-23 DIAGNOSIS — Z1211 Encounter for screening for malignant neoplasm of colon: Secondary | ICD-10-CM

## 2023-04-23 DIAGNOSIS — M23200 Derangement of unspecified lateral meniscus due to old tear or injury, right knee: Secondary | ICD-10-CM

## 2023-04-23 DIAGNOSIS — G8929 Other chronic pain: Secondary | ICD-10-CM

## 2023-04-23 DIAGNOSIS — M23203 Derangement of unspecified medial meniscus due to old tear or injury, right knee: Secondary | ICD-10-CM | POA: Diagnosis not present

## 2023-04-23 DIAGNOSIS — Z131 Encounter for screening for diabetes mellitus: Secondary | ICD-10-CM | POA: Diagnosis not present

## 2023-04-23 DIAGNOSIS — E7849 Other hyperlipidemia: Secondary | ICD-10-CM

## 2023-04-23 DIAGNOSIS — Z0000 Encounter for general adult medical examination without abnormal findings: Secondary | ICD-10-CM | POA: Diagnosis not present

## 2023-04-23 MED ORDER — BUPROPION HCL ER (XL) 150 MG PO TB24: 150.0000 mg | ORAL_TABLET | Freq: Every day | ORAL | 3 refills | Status: DC

## 2023-04-23 NOTE — Patient Instructions (Signed)
Health Maintenance After Age 65 After age 65, you are at a higher risk for certain long-term diseases and infections as well as injuries from falls. Falls are a major cause of broken bones and head injuries in people who are older than age 65. Getting regular preventive care can help to keep you healthy and well. Preventive care includes getting regular testing and making lifestyle changes as recommended by your health care provider. Talk with your health care provider about: Which screenings and tests you should have. A screening is a test that checks for a disease when you have no symptoms. A diet and exercise plan that is right for you. What should I know about screenings and tests to prevent falls? Screening and testing are the best ways to find a health problem early. Early diagnosis and treatment give you the best chance of managing medical conditions that are common after age 65. Certain conditions and lifestyle choices may make you more likely to have a fall. Your health care provider may recommend: Regular vision checks. Poor vision and conditions such as cataracts can make you more likely to have a fall. If you wear glasses, make sure to get your prescription updated if your vision changes. Medicine review. Work with your health care provider to regularly review all of the medicines you are taking, including over-the-counter medicines. Ask your health care provider about any side effects that may make you more likely to have a fall. Tell your health care provider if any medicines that you take make you feel dizzy or sleepy. Strength and balance checks. Your health care provider may recommend certain tests to check your strength and balance while standing, walking, or changing positions. Foot health exam. Foot pain and numbness, as well as not wearing proper footwear, can make you more likely to have a fall. Screenings, including: Osteoporosis screening. Osteoporosis is a condition that causes  the bones to get weaker and break more easily. Blood pressure screening. Blood pressure changes and medicines to control blood pressure can make you feel dizzy. Depression screening. You may be more likely to have a fall if you have a fear of falling, feel depressed, or feel unable to do activities that you used to do. Alcohol use screening. Using too much alcohol can affect your balance and may make you more likely to have a fall. Follow these instructions at home: Lifestyle Do not drink alcohol if: Your health care provider tells you not to drink. If you drink alcohol: Limit how much you have to: 0-1 drink a day for women. 0-2 drinks a day for men. Know how much alcohol is in your drink. In the [place] one drink equals one 12 oz bottle of beer (355 mL), one 5 oz glass of wine (148 mL), or one 1 oz glass of hard liquor (44 mL). Do not use any products that contain nicotine or tobacco. These products include cigarettes, chewing tobacco, and vaping devices, such as e-cigarettes. If you need help quitting, ask your health care provider. Activity  Follow a regular exercise program to stay fit. This will help you maintain your balance. Ask your health care provider what types of exercise are appropriate for you. If you need a cane or walker, use it as recommended by your health care provider. Wear supportive shoes that have nonskid soles. Safety  Remove any tripping hazards, such as rugs, cords, and clutter. Install safety equipment such as grab bars in bathrooms and safety rails on stairs. Keep rooms and walkways   well-lit. General instructions Talk with your health care provider about your risks for falling. Tell your health care provider if: You fall. Be sure to tell your health care provider about all falls, even ones that seem minor. You feel dizzy, tiredness (fatigue), or off-balance. Take over-the-counter and prescription medicines only as told by your health care provider. These include  supplements. Eat a healthy diet and maintain a healthy weight. A healthy diet includes low-fat dairy products, low-fat (lean) meats, and fiber from whole grains, beans, and lots of fruits and vegetables. Stay current with your vaccines. Schedule regular health, dental, and eye exams. Summary Having a healthy lifestyle and getting preventive care can help to protect your health and wellness after age 65. Screening and testing are the best way to find a health problem early and help you avoid having a fall. Early diagnosis and treatment give you the best chance for managing medical conditions that are more common for people who are older than age 65. Falls are a major cause of broken bones and head injuries in people who are older than age 65. Take precautions to prevent a fall at home. Work with your health care provider to learn what changes you can make to improve your health and wellness and to prevent falls. This information is not intended to replace advice given to you by your health care provider. Make sure you discuss any questions you have with your health care provider. Document Revised: 09/27/2020 Document Reviewed: 09/27/2020 Elsevier Patient Education  2024 Elsevier Inc.  

## 2023-04-23 NOTE — Progress Notes (Signed)
Complete physical exam  Patient: Dawn Dunlap   DOB: [birth year]   67 y.o. Female  MRN: [id number]  Subjective:    Chief Complaint  Patient presents with   Annual Exam    Non fasting labs    Dawn Dunlap is a 67 y.o. female who presents today for a complete physical exam. She reports consuming a general diet. She generally feels well. She reports sleeping fairly well. She does have additional problems to discuss today.  Wants to discuss results of right knee MRI today. She got the MRI in September in [place], [place] which showed medial and lateral meniscal tears, grade 1 MCL sprain, and tricompartmental osteoarthritis. She has not had any treatment for her knee. Her knee is feeling better. She originally hurt it while playing pickle ball. She does report occasional whole lateral right leg pain that is a "burning" sensation. Denies falls.  BP elevated today. She is not checking at home BPs.  Weight loss from 170 lbs to 156 lbs. Stopped drinking coca cola and started walking more. She is on no weight loss medication.  Most recent fall risk assessment:    04/23/2023    9:37 AM  Fall Risk   Falls in the past year? 0  Number falls in past yr: 0  Injury with Fall? 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 02/13/2023   Chronic pain of left knee 01/10/2023   Primary osteoarthritis of right knee 01/10/2023   Gallstones 07/12/2022   Elevated blood  pressure reading 07/07/2022   Epigastric pain 07/05/2022   Right upper quadrant pain 07/05/2022   Hiatal hernia 07/05/2022   Mass of upper inner quadrant of left breast 04/11/2022   Hyperplastic polyp of sigmoid colon 10/10/2016   GERD (gastroesophageal reflux disease) 06/20/2016   Hyperlipidemia 06/20/2016   Irritable bowel syndrome with diarrhea 06/20/2016   Past Medical History:  Diagnosis Date   GERD (gastroesophageal reflux disease)    Hyperlipidemia       Patient Care Team: Nolene Ebbs as PCP - General (Family Medicine)   Outpatient Medications Prior to Visit  Medication Sig   ALPRAZolam (XANAX) 0.5 MG tablet Take 0.5-1 tablets (0.25-0.5 mg total) by mouth at bedtime as needed for anxiety (Use sparingly to prevent tolerance/dependence).   clobetasol cream (TEMOVATE) 0.05 % Apply 1 Application topically 2 (two) times daily.   omeprazole (PRILOSEC) 40 MG capsule Take 1 capsule (40 mg total) by mouth daily.   simvastatin (ZOCOR) 40 MG tablet Take 0.5 tablets (20 mg total) by mouth daily.   [DISCONTINUED] buPROPion (WELLBUTRIN XL) 150 MG 24 hr tablet Take 1 tablet (150 mg total) by mouth daily.   [DISCONTINUED] hydrOXYzine (ATARAX) 10 MG tablet Take 1 tablet (10 mg total) by mouth 3 (three) times daily as needed for itching.   No facility-administered medications prior  to visit.    Review of Systems  Respiratory:  Negative for shortness of breath.   Cardiovascular:  Negative for chest pain and leg swelling.  Gastrointestinal:  Negative for constipation and diarrhea.  Musculoskeletal:  Negative for falls.  Psychiatric/Behavioral:  Negative for depression. The patient is not nervous/anxious.         Objective:     BP (!) 138/92 (BP Location: Right Arm)   Pulse 90   Ht 4\' 11"  (1.499 m)   Wt 156 lb (70.8 kg)   SpO2 99%   BMI 31.51 kg/m  BP Readings from Last 3 Encounters:  04/23/23 (!) 138/92  01/05/23 136/85  07/05/22 (!) 154/69   Wt Readings from  Last 3 Encounters:  04/23/23 156 lb (70.8 kg)  01/05/23 161 lb (73 kg)  07/05/22 174 lb 0.6 oz (78.9 kg)      Physical Exam Constitutional:      Appearance: Normal appearance.  HENT:     Right Ear: Tympanic membrane, ear canal and external ear normal.     Left Ear: Tympanic membrane, ear canal and external ear normal.     Nose: Nose normal. No congestion or rhinorrhea.     Mouth/Throat:     Mouth: Mucous membranes are moist.     Pharynx: No oropharyngeal exudate or posterior oropharyngeal erythema.  Eyes:     Conjunctiva/sclera: Conjunctivae normal.  Cardiovascular:     Rate and Rhythm: Normal rate and regular rhythm.     Pulses: Normal pulses.     Heart sounds: Normal heart sounds.  Pulmonary:     Effort: Pulmonary effort is normal.     Breath sounds: Normal breath sounds.  Abdominal:     General: Bowel sounds are normal. There is no distension.     Palpations: There is no mass.     Tenderness: There is no abdominal tenderness. There is no guarding or rebound.     Hernia: No hernia is present.  Musculoskeletal:        General: No swelling.     Lumbar back: Normal. No swelling, deformity, tenderness or bony tenderness.     Right knee: No swelling, deformity, effusion, erythema or bony tenderness. Normal range of motion. No tenderness. Normal alignment.     Left knee: Normal.  Neurological:     Mental Status: She is alert.      Assessment & Plan:    Routine Health Maintenance and Physical Exam  Immunization History  Administered Date(s) Administered   Influenza Split 01/23/2012   Influenza, Seasonal, Injecte, Preservative Fre 02/17/2014, 04/19/2015   Influenza,inj,Quad PF,6+ Mos 02/23/2018   Influenza-Unspecified 03/05/2013, 04/05/2016, 03/22/2017, 02/19/2019, 04/21/2020, 03/28/2022, 03/23/2023   PFIZER(Purple Top)SARS-COV-2 Vaccination 08/06/2019, 08/27/2019, 04/21/2020   Pneumococcal Conjugate-13 04/19/2015   Pneumococcal Polysaccharide-23 02/19/2014   Tdap  08/24/2014    Health Maintenance  Topic Date Due   Medicare Annual Wellness (AWV)  Never done   COVID-19 Vaccine (4 - 2023-24 season) 05/09/2023 (Originally 01/21/2023)   Pneumonia Vaccine 9+ Years old (3 of 3 - PPSV23 or PCV20) 07/06/2023 (Originally 09/10/2021)   DEXA SCAN  07/06/2023 (Originally 09/10/2021)   Colonoscopy  07/06/2023 (Originally 07/11/2021)   Hepatitis C Screening  07/06/2023 (Originally 09/11/1974)   Zoster Vaccines- Shingrix (1 of 2) 07/22/2023 (Originally 09/11/2006)   MAMMOGRAM  12/29/2023   DTaP/Tdap/Td (2 - Td or Tdap) 08/23/2024   INFLUENZA VACCINE  Completed   HPV VACCINES  Aged Out    Discussed health benefits of physical activity, and encouraged her  to engage in regular exercise appropriate for her age and condition. Marland KitchenMarylu Lund was seen today for annual exam.  Diagnoses and all orders for this visit:  Annual physical exam -     CBC w/Diff/Platelet -     CMP14+EGFR -     Lipid panel -     TSH -     Vitamin D (25 hydroxy)  Other hyperlipidemia -     Lipid panel  Screening for diabetes mellitus -     CMP14+EGFR  Anxiety Comments: Will try reduced dose Welbutrin and come off sometime next month or two. If ok can stay off. If want to continue, let me know  Orders: -     buPROPion (WELLBUTRIN XL) 150 MG 24 hr tablet; Take 1 tablet (150 mg total) by mouth daily.  Chronic pain of left knee -     Ambulatory referral to Orthopedic Surgery  Old tear of lateral meniscus of right knee, unspecified tear type -     Ambulatory referral to Orthopedic Surgery  Old tear of medial meniscus of right knee, unspecified tear type -     Ambulatory referral to Orthopedic Surgery  Localized osteoarthritis of right knee -     Ambulatory referral to Orthopedic Surgery    .[name] Discussed 150 minutes of exercise a week.  Encouraged vitamin D 1000 units and Calcium 1300mg  or 4 servings of dairy a day.  No concerns with mood Fasting labs ordered Mammogram scheduled for  12/4 Pap not needed Declined dexa Declined colonoscopy, discussed risk of this Will order cologuard  Referral for ortho sent for right knee meniscal tears via MRI done in [place]. She also has OA. Needs ortho consult. Referral made today.   Patient's 1st BP elevated and 2nd BP ULN. Discussed with patient BP goal of <140/90.  Advised patient to take at home BPs. Discussed the possibility of antihypertensives if her BP remains elevated.   COVID, Flu, and pneumonia vaccines UTD. On statin, recheck lipid level.   Return in about 6 months (around 10/22/2023).     Tandy Gaw, PA-C

## 2023-04-25 ENCOUNTER — Ambulatory Visit (INDEPENDENT_AMBULATORY_CARE_PROVIDER_SITE_OTHER): Payer: No Typology Code available for payment source

## 2023-04-25 DIAGNOSIS — Z1231 Encounter for screening mammogram for malignant neoplasm of breast: Secondary | ICD-10-CM

## 2023-04-25 DIAGNOSIS — Z131 Encounter for screening for diabetes mellitus: Secondary | ICD-10-CM | POA: Diagnosis not present

## 2023-04-25 DIAGNOSIS — E7849 Other hyperlipidemia: Secondary | ICD-10-CM | POA: Diagnosis not present

## 2023-04-25 DIAGNOSIS — Z0000 Encounter for general adult medical examination without abnormal findings: Secondary | ICD-10-CM | POA: Diagnosis not present

## 2023-04-26 LAB — LIPID PANEL
Chol/HDL Ratio: 3.5 {ratio} (ref 0.0–4.4)
Cholesterol, Total: 212 mg/dL — ABNORMAL HIGH (ref 100–199)
HDL: 60 mg/dL (ref >39)
LDL Chol Calc (NIH): 123 mg/dL — ABNORMAL HIGH (ref 0–99)
Triglycerides: 167 mg/dL — ABNORMAL HIGH (ref 0–149)
VLDL Cholesterol Cal: 29 mg/dL (ref 5–40)

## 2023-04-26 LAB — CBC WITH DIFFERENTIAL/PLATELET
Basophils Absolute: 0 10*3/uL (ref 0.0–0.2)
Basos: 1 %
EOS (ABSOLUTE): 0.6 10*3/uL — ABNORMAL HIGH (ref 0.0–0.4)
Eos: 7 %
Hematocrit: 38 % (ref 34.0–46.6)
Hemoglobin: 12.8 g/dL (ref 11.1–15.9)
Immature Grans (Abs): 0 10*3/uL (ref 0.0–0.1)
Immature Granulocytes: 0 %
Lymphocytes Absolute: 2.3 10*3/uL (ref 0.7–3.1)
Lymphs: 27 %
MCH: 32.5 pg (ref 26.6–33.0)
MCHC: 33.7 g/dL (ref 31.5–35.7)
MCV: 96 fL (ref 79–97)
Monocytes Absolute: 0.6 10*3/uL (ref 0.1–0.9)
Monocytes: 6 %
Neutrophils Absolute: 5.1 10*3/uL (ref 1.4–7.0)
Neutrophils: 59 %
Platelets: 365 10*3/uL (ref 150–450)
RBC: 3.94 x10E6/uL (ref 3.77–5.28)
RDW: 12 % (ref 11.7–15.4)
WBC: 8.6 10*3/uL (ref 3.4–10.8)

## 2023-04-26 LAB — CMP14+EGFR
ALT: 16 [IU]/L (ref 0–32)
AST: 17 [IU]/L (ref 0–40)
Albumin: 4.3 g/dL (ref 3.9–4.9)
Alkaline Phosphatase: 105 [IU]/L (ref 44–121)
BUN/Creatinine Ratio: 17 (ref 12–28)
BUN: 14 mg/dL (ref 8–27)
Bilirubin Total: 0.3 mg/dL (ref 0.0–1.2)
CO2: 26 mmol/L (ref 20–29)
Calcium: 9.2 mg/dL (ref 8.7–10.3)
Chloride: 101 mmol/L (ref 96–106)
Creatinine, Ser: 0.81 mg/dL (ref 0.57–1.00)
Globulin, Total: 2.3 g/dL (ref 1.5–4.5)
Glucose: 91 mg/dL (ref 70–99)
Potassium: 4.4 mmol/L (ref 3.5–5.2)
Sodium: 141 mmol/L (ref 134–144)
Total Protein: 6.6 g/dL (ref 6.0–8.5)
eGFR: 80 mL/min/{1.73_m2} (ref >59)

## 2023-04-26 LAB — TSH: TSH: 4.35 u[IU]/mL (ref 0.450–4.500)

## 2023-04-26 LAB — VITAMIN D 25 HYDROXY (VIT D DEFICIENCY, FRACTURES): Vit D, 25-Hydroxy: 55.1 ng/mL (ref 30.0–100.0)

## 2023-04-27 NOTE — Progress Notes (Signed)
Normal mammogram. Follow up in 1 year.

## 2023-04-27 NOTE — Progress Notes (Signed)
[place],   Hemoglobin looks good.  Kidney, liver, glucose looks great.  TSH in normal range and stable from 4 years ago.  Vitamin d looks GREAT.   HDL, good cholesterol, looks GREAT.  LDL, bad cholesterol, not to goal.  TG a little elevated.   Can you increase to 1 full tablet for zocor?   Marland KitchenMarland KitchenThe 10-year ASCVD risk score (Arnett DK, et al., 2019) is: 7.1%   Values used to calculate the score:     Age: 67 years     Sex: Female     Is Non-Hispanic African American: No     Diabetic: No     Tobacco smoker: No     Systolic Blood Pressure: 138 mmHg     Is BP treated: No     HDL Cholesterol: 60 mg/dL     Total Cholesterol: 212 mg/dL

## 2023-05-10 DIAGNOSIS — Z1211 Encounter for screening for malignant neoplasm of colon: Secondary | ICD-10-CM | POA: Diagnosis not present

## 2023-05-19 LAB — COLOGUARD: COLOGUARD: NEGATIVE

## 2023-05-21 NOTE — Progress Notes (Signed)
Cologuard negative. GREAT news. Follow up in 3 years with colonoscopy or cologuard.

## 2023-05-22 DIAGNOSIS — M25561 Pain in right knee: Secondary | ICD-10-CM | POA: Diagnosis not present

## 2023-06-04 ENCOUNTER — Ambulatory Visit (INDEPENDENT_AMBULATORY_CARE_PROVIDER_SITE_OTHER): Payer: Medicare HMO | Admitting: Physician Assistant

## 2023-06-04 VITALS — BP 134/84 | HR 105 | Ht 59.0 in | Wt 161.0 lb

## 2023-06-04 DIAGNOSIS — R058 Other specified cough: Secondary | ICD-10-CM | POA: Diagnosis not present

## 2023-06-04 DIAGNOSIS — J0140 Acute pansinusitis, unspecified: Secondary | ICD-10-CM | POA: Diagnosis not present

## 2023-06-04 DIAGNOSIS — H9313 Tinnitus, bilateral: Secondary | ICD-10-CM | POA: Diagnosis not present

## 2023-06-04 DIAGNOSIS — T753XXD Motion sickness, subsequent encounter: Secondary | ICD-10-CM

## 2023-06-04 MED ORDER — ONDANSETRON 8 MG PO TBDP: 8.0000 mg | ORAL_TABLET | Freq: Three times a day (TID) | ORAL | 1 refills | Status: AC | PRN

## 2023-06-04 MED ORDER — ALBUTEROL SULFATE HFA 108 (90 BASE) MCG/ACT IN AERS: 2.0000 | INHALATION_SPRAY | Freq: Four times a day (QID) | RESPIRATORY_TRACT | 0 refills | Status: AC | PRN

## 2023-06-04 MED ORDER — AZITHROMYCIN 250 MG PO TABS: ORAL_TABLET | ORAL | 0 refills | Status: DC

## 2023-06-04 MED ORDER — METHYLPREDNISOLONE 4 MG PO TBPK: ORAL_TABLET | ORAL | 0 refills | Status: DC

## 2023-06-04 NOTE — Progress Notes (Signed)
 Acute Office Visit  Subjective:     Dawn Dunlap ID: Dawn Dunlap, female    DOB: 29-Oct-1955, 68 y.o.   MRN: [id number]  Chief Complaint  Dawn Dunlap presents with   Cough    f/u lingering cough from a cold in november    HPI Dawn Dunlap is in today for dry cough since URI in November. Not been seen by any provider or given antibiotic. Continues to have some sinus pressure Dawn post nasal drip. Denies any reflux. Dawn Dunlap has had tinnitus for months. No hearing loss. It is in both ears. Dawn Dunlap has taken OTC medications off Dawn on Dawn help some but cough not resolving. Dawn Dunlap is traveling out of country next week Dawn wants to be checked out.   .. Active Ambulatory Problems    Diagnosis Date Noted   GERD (gastroesophageal reflux disease) 06/20/2016   Hyperlipidemia 06/20/2016   Irritable bowel syndrome with diarrhea 06/20/2016   Hyperplastic polyp of sigmoid colon 10/10/2016   Mass of upper inner quadrant of left breast 04/11/2022   Epigastric pain 07/05/2022   Right upper quadrant pain 07/05/2022   Hiatal hernia 07/05/2022   Elevated blood pressure reading 07/07/2022   Gallstones 07/12/2022   Chronic pain of left knee 01/10/2023   Primary osteoarthritis of right knee 01/10/2023   Tricompartment osteoarthritis of right knee 02/13/2023   Grade I MCL Sprain of Right Knee 02/13/2023   Tear of medial meniscus of right knee 02/13/2023   Tear of lateral meniscus of right knee 02/13/2023   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     Objective:    BP 134/84   Pulse (!) 105   Ht 4' 11 (1.499 m)   Wt 161 lb (73 kg)   SpO2 99%   BMI 32.52 kg/m  BP Readings from Last 3 Encounters:  06/04/23 134/84  04/23/23 (!) 138/92  01/05/23 136/85   Wt Readings from Last 3 Encounters:  06/04/23 161 lb (73 kg)   04/23/23 156 lb (70.8 kg)  01/05/23 161 lb (73 kg)      Physical Exam HENT:     Head: Normocephalic.     Right Ear: Tympanic membrane, ear canal Dawn external ear normal. There is no impacted cerumen.     Left Ear: Tympanic membrane, ear canal Dawn external ear normal. There is no impacted cerumen.     Ears:     Comments: Gross hearing intact    Nose: Nose normal. No congestion or rhinorrhea.     Mouth/Throat:     Mouth: Mucous membranes are moist.     Pharynx: Posterior oropharyngeal erythema present.     Comments: Post nasal drip present Eyes:     Extraocular Movements: Extraocular movements intact.     Conjunctiva/sclera: Conjunctivae normal.     Pupils: Pupils are equal, round, Dawn reactive to light.  Cardiovascular:     Rate Dawn Rhythm: Normal rate Dawn regular rhythm.  Pulmonary:     Effort: Pulmonary effort is normal.     Breath sounds: Normal breath sounds. No wheezing, rhonchi or rales.     Comments: Cough with inspiration Musculoskeletal:     Cervical back: Normal range of motion Dawn neck supple. No tenderness.  Lymphadenopathy:     Cervical: No cervical adenopathy.  Neurological:  General: No focal deficit present.     Mental Status: Dawn Dunlap, Dawn Dunlap, Dawn Dunlap.  Psychiatric:        Mood Dawn Affect: Mood normal.          Assessment & Plan:  .Dawn Dunlap was seen today for cough.  Diagnoses Dawn all orders for this visit:  Post-viral cough syndrome -     methylPREDNISolone  (MEDROL  DOSEPAK) 4 MG TBPK tablet; Take as directed by package insert. -     albuterol  (VENTOLIN  HFA) 108 (90 Base) MCG/ACT inhaler; Inhale 2 puffs into the lungs every 6 (six) hours as needed.  Motion sickness, subsequent encounter -     ondansetron  (ZOFRAN -ODT) 8 MG disintegrating tablet; Take 1 tablet (8 mg total) by mouth every 8 (eight) hours as needed.  Tinnitus of both ears -     methylPREDNISolone  (MEDROL  DOSEPAK) 4 MG TBPK tablet; Take as directed by  package insert.  Acute non-recurrent pansinusitis -     azithromycin  (ZITHROMAX  Z-PAK) 250 MG tablet; Take 2 tablets (500 mg) on  Day 1,  followed by 1 tablet (250 mg) once daily on Days 2 through 5.   Vitals look good on 2nd recheck Suspect symptoms are post viral Dawn treated with medrol  dose pack Dawn albuterol  prn Pt is traveling out of country soon sent zpak if sinusitis symptoms persist Zofran  for motion sickeness Dawn nausea Steroid should help with tinnitus No hearing loss noted Follow up or consider ENT if symptoms persist   Dawn Bologna, PA-C

## 2023-06-04 NOTE — Patient Instructions (Signed)
 Start medrol  dose pack and albuterol  Zpak only if not improving or worsening  Cough, Adult Coughing is a reflex that clears your throat and airways (respiratory system). It helps heal and protect your lungs. It is normal to cough from time to time. A cough that happens with other symptoms or that lasts a long time may be a sign of a condition that needs treatment. A short-term (acute) cough may only last 2-3 weeks. A long-term (chronic) cough may last 8 or more weeks. Coughing is often caused by: Diseases, such as: An infection of the respiratory system. Asthma or other heart or lung diseases. Gastroesophageal reflux. This is when acid comes back up from the stomach. Breathing in things that irritate your lungs. Allergies. Postnasal drip. This is when mucus runs down the back of your throat. Smoking. Some medicines. Follow these instructions at home: Medicines Take over-the-counter and prescription medicines only as told by your health care provider. Talk with your provider before you take cough medicine (cough suppressants). Eating and drinking Do not drink alcohol. Avoid caffeine. Drink enough fluid to keep your pee (urine) pale yellow. Lifestyle Avoid cigarette smoke. Do not use any products that contain nicotine or tobacco. These products include cigarettes, chewing tobacco, and vaping devices, such as e-cigarettes. If you need help quitting, ask your provider. Avoid things that make you cough. These may include perfumes, candles, cleaning products, or campfire smoke. General instructions  Watch for any changes to your cough. Tell your provider about them. Always cover your mouth when you cough. If the air is dry in your bedroom or home, use a cool mist vaporizer or humidifier. If your cough is worse at night, try to sleep in a semi-upright position. Rest as needed. Contact a health care provider if: You have new symptoms, or your symptoms get worse. You cough up pus. You  have a fever that does not go away or a cough that does not get better after 2-3 weeks. You cannot control your cough with medicine, and you are losing sleep. You have pain that gets worse or is not helped with medicine. You lose weight for no clear reason. You have night sweats. Get help right away if: You cough up blood. You have trouble breathing. Your heart is beating very fast. These symptoms may be an emergency. Get help right away. Call 911. Do not wait to see if the symptoms will go away. Do not drive yourself to the hospital. This information is not intended to replace advice given to you by your health care provider. Make sure you discuss any questions you have with your health care provider. Document Revised: 01/06/2022 Document Reviewed: 01/06/2022 Elsevier Patient Education  2024 [employer].

## 2023-06-05 ENCOUNTER — Encounter: Payer: Self-pay | Admitting: Physician Assistant

## 2023-07-31 DIAGNOSIS — L400 Psoriasis vulgaris: Secondary | ICD-10-CM | POA: Diagnosis not present

## 2023-07-31 DIAGNOSIS — L508 Other urticaria: Secondary | ICD-10-CM | POA: Diagnosis not present

## 2023-08-29 ENCOUNTER — Other Ambulatory Visit: Payer: Self-pay | Admitting: Physician Assistant

## 2023-08-29 DIAGNOSIS — K219 Gastro-esophageal reflux disease without esophagitis: Secondary | ICD-10-CM

## 2023-08-29 DIAGNOSIS — R1013 Epigastric pain: Secondary | ICD-10-CM

## 2023-08-29 DIAGNOSIS — R1011 Right upper quadrant pain: Secondary | ICD-10-CM

## 2023-08-29 DIAGNOSIS — K449 Diaphragmatic hernia without obstruction or gangrene: Secondary | ICD-10-CM

## 2023-09-11 DIAGNOSIS — D225 Melanocytic nevi of trunk: Secondary | ICD-10-CM | POA: Diagnosis not present

## 2023-09-11 DIAGNOSIS — L508 Other urticaria: Secondary | ICD-10-CM | POA: Diagnosis not present

## 2023-09-11 DIAGNOSIS — Z129 Encounter for screening for malignant neoplasm, site unspecified: Secondary | ICD-10-CM | POA: Diagnosis not present

## 2023-09-11 DIAGNOSIS — L400 Psoriasis vulgaris: Secondary | ICD-10-CM | POA: Diagnosis not present

## 2023-09-11 DIAGNOSIS — L821 Other seborrheic keratosis: Secondary | ICD-10-CM | POA: Diagnosis not present

## 2023-09-11 DIAGNOSIS — D369 Benign neoplasm, unspecified site: Secondary | ICD-10-CM | POA: Diagnosis not present

## 2023-09-21 ENCOUNTER — Other Ambulatory Visit: Payer: Self-pay | Admitting: Physician Assistant

## 2023-09-21 DIAGNOSIS — J0140 Acute pansinusitis, unspecified: Secondary | ICD-10-CM

## 2023-09-26 DIAGNOSIS — Z87891 Personal history of nicotine dependence: Secondary | ICD-10-CM | POA: Diagnosis not present

## 2023-09-26 DIAGNOSIS — H538 Other visual disturbances: Secondary | ICD-10-CM | POA: Diagnosis not present

## 2023-09-26 DIAGNOSIS — Z79899 Other long term (current) drug therapy: Secondary | ICD-10-CM | POA: Diagnosis not present

## 2023-09-26 DIAGNOSIS — H539 Unspecified visual disturbance: Secondary | ICD-10-CM | POA: Diagnosis not present

## 2023-09-26 DIAGNOSIS — R519 Headache, unspecified: Secondary | ICD-10-CM | POA: Diagnosis not present

## 2023-09-27 DIAGNOSIS — Z1330 Encounter for screening examination for mental health and behavioral disorders, unspecified: Secondary | ICD-10-CM | POA: Diagnosis not present

## 2023-09-27 DIAGNOSIS — H539 Unspecified visual disturbance: Secondary | ICD-10-CM | POA: Diagnosis not present

## 2023-10-26 DIAGNOSIS — G459 Transient cerebral ischemic attack, unspecified: Secondary | ICD-10-CM | POA: Diagnosis not present

## 2023-10-26 DIAGNOSIS — H47323 Drusen of optic disc, bilateral: Secondary | ICD-10-CM | POA: Diagnosis not present

## 2023-10-26 DIAGNOSIS — H25813 Combined forms of age-related cataract, bilateral: Secondary | ICD-10-CM | POA: Diagnosis not present

## 2023-11-13 ENCOUNTER — Telehealth: Payer: Self-pay

## 2023-11-13 DIAGNOSIS — G43109 Migraine with aura, not intractable, without status migrainosus: Secondary | ICD-10-CM | POA: Insufficient documentation

## 2023-11-13 DIAGNOSIS — G459 Transient cerebral ischemic attack, unspecified: Secondary | ICD-10-CM | POA: Insufficient documentation

## 2023-11-13 DIAGNOSIS — H47323 Drusen of optic disc, bilateral: Secondary | ICD-10-CM | POA: Insufficient documentation

## 2023-11-13 NOTE — Telephone Encounter (Signed)
 Copied from CRM [id number]. Topic: Clinical - Medical Advice >> Nov 13, 2023  3:24 PM Adrianna P wrote: Reason for CRM: Patient needs carotid checked please call patient to schedule if this is something that can be done in office, due to her having an ocular migraine, [facility] recommended it

## 2023-11-13 NOTE — Telephone Encounter (Signed)
 I can see notes. Carotid doppler ordered for [facility]. They should reach out and schedule. Give patient number to call if not heard from them in next day or so.

## 2023-11-13 NOTE — Telephone Encounter (Signed)
 Pt has been seen by [facility] for drusen of both optic discs/TIA/ophthalmic migraine and suggest carotid doppler. Will place order.

## 2023-11-14 NOTE — Telephone Encounter (Signed)
 Patient informed and given number to  [facility] of [phone].

## 2023-12-03 ENCOUNTER — Ambulatory Visit (HOSPITAL_BASED_OUTPATIENT_CLINIC_OR_DEPARTMENT_OTHER)
Admission: RE | Admit: 2023-12-03 | Discharge: 2023-12-03 | Disposition: A | Discharge status: Home / Self Care | Source: Ambulatory Visit | Attending: Physician Assistant | Admitting: Physician Assistant

## 2023-12-03 ENCOUNTER — Ambulatory Visit: Payer: Self-pay | Admitting: Physician Assistant

## 2023-12-03 DIAGNOSIS — G43109 Migraine with aura, not intractable, without status migrainosus: Secondary | ICD-10-CM | POA: Insufficient documentation

## 2023-12-03 DIAGNOSIS — H47323 Drusen of optic disc, bilateral: Secondary | ICD-10-CM | POA: Diagnosis not present

## 2023-12-03 DIAGNOSIS — G459 Transient cerebral ischemic attack, unspecified: Secondary | ICD-10-CM | POA: Insufficient documentation

## 2023-12-03 NOTE — Progress Notes (Signed)
 Carotid ultrasounds did not show any significant plaque or stenosis(narrowing) GREAT news.

## 2023-12-04 DIAGNOSIS — L508 Other urticaria: Secondary | ICD-10-CM | POA: Diagnosis not present

## 2023-12-11 DIAGNOSIS — H25813 Combined forms of age-related cataract, bilateral: Secondary | ICD-10-CM | POA: Diagnosis not present

## 2023-12-11 DIAGNOSIS — H47323 Drusen of optic disc, bilateral: Secondary | ICD-10-CM | POA: Diagnosis not present

## 2023-12-11 DIAGNOSIS — G459 Transient cerebral ischemic attack, unspecified: Secondary | ICD-10-CM | POA: Diagnosis not present

## 2024-03-03 ENCOUNTER — Other Ambulatory Visit: Payer: Self-pay | Admitting: Physician Assistant

## 2024-03-03 DIAGNOSIS — E7849 Other hyperlipidemia: Secondary | ICD-10-CM

## 2024-03-03 MED ORDER — SIMVASTATIN 40 MG PO TABS: 20.0000 mg | ORAL_TABLET | Freq: Every day | ORAL | 0 refills | Status: DC

## 2024-03-03 NOTE — Telephone Encounter (Signed)
 Copied from CRM [id number]. Topic: Clinical - Medication Refill >> Mar 03, 2024 11:48 AM Joesph B wrote: Medication:  simvastatin  (ZOCOR ) 40 MG tablet   Has the patient contacted their pharmacy? Yes (Agent: If no, request that the patient contact the pharmacy for the refill. If patient does not wish to contact the pharmacy document the reason why and proceed with request.) (Agent: If yes, when and what did the pharmacy advise?)  This is the patient's preferred pharmacy:  [id number] - [place], [place] - [address] [place] [address] Phone: [phone] Fax: [phone]  Is this the correct pharmacy for this prescription? Yes If no, delete pharmacy and type the correct one.   Has the prescription been filled recently? No  Is the patient out of the medication? Yes  Has the patient been seen for an appointment in the last year OR does the patient have an upcoming appointment? Yes  Can we respond through MyChart? Yes  Agent: Please be advised that Rx refills may take up to 3 business days. We ask that you follow-up with your pharmacy.

## 2024-04-24 ENCOUNTER — Other Ambulatory Visit: Payer: Self-pay | Admitting: Physician Assistant

## 2024-04-24 DIAGNOSIS — F419 Anxiety disorder, unspecified: Secondary | ICD-10-CM

## 2024-06-04 ENCOUNTER — Encounter: Admitting: Physician Assistant

## 2024-06-06 ENCOUNTER — Encounter: Admitting: Physician Assistant

## 2024-06-06 DIAGNOSIS — Z0000 Encounter for general adult medical examination without abnormal findings: Secondary | ICD-10-CM

## 2024-06-10 ENCOUNTER — Ambulatory Visit (INDEPENDENT_AMBULATORY_CARE_PROVIDER_SITE_OTHER): Admitting: Physician Assistant

## 2024-06-10 ENCOUNTER — Encounter: Payer: Self-pay | Admitting: Physician Assistant

## 2024-06-10 ENCOUNTER — Other Ambulatory Visit: Payer: Self-pay

## 2024-06-10 VITALS — BP 146/80 | HR 110 | Wt 170.8 lb

## 2024-06-10 DIAGNOSIS — E782 Mixed hyperlipidemia: Secondary | ICD-10-CM | POA: Diagnosis not present

## 2024-06-10 DIAGNOSIS — L509 Urticaria, unspecified: Secondary | ICD-10-CM

## 2024-06-10 DIAGNOSIS — Z1231 Encounter for screening mammogram for malignant neoplasm of breast: Secondary | ICD-10-CM

## 2024-06-10 DIAGNOSIS — Z1159 Encounter for screening for other viral diseases: Secondary | ICD-10-CM

## 2024-06-10 DIAGNOSIS — Z13820 Encounter for screening for osteoporosis: Secondary | ICD-10-CM

## 2024-06-10 DIAGNOSIS — G8929 Other chronic pain: Secondary | ICD-10-CM

## 2024-06-10 DIAGNOSIS — Z0000 Encounter for general adult medical examination without abnormal findings: Secondary | ICD-10-CM

## 2024-06-10 DIAGNOSIS — M542 Cervicalgia: Secondary | ICD-10-CM | POA: Diagnosis not present

## 2024-06-10 DIAGNOSIS — L659 Nonscarring hair loss, unspecified: Secondary | ICD-10-CM

## 2024-06-10 DIAGNOSIS — E7849 Other hyperlipidemia: Secondary | ICD-10-CM

## 2024-06-10 DIAGNOSIS — Z131 Encounter for screening for diabetes mellitus: Secondary | ICD-10-CM | POA: Diagnosis not present

## 2024-06-10 MED ORDER — SIMVASTATIN 40 MG PO TABS: 20.0000 mg | ORAL_TABLET | Freq: Every day | ORAL | 0 refills | Status: AC

## 2024-06-10 MED ORDER — MELOXICAM 15 MG PO TABS: 15.0000 mg | ORAL_TABLET | Freq: Every day | ORAL | 1 refills | Status: AC

## 2024-06-10 NOTE — Patient Instructions (Signed)
 Cervical Strain and Sprain Rehab Ask your health care provider which exercises are safe for you. Do exercises exactly as told by your health care provider and adjust them as directed. It is normal to feel mild stretching, pulling, tightness, or discomfort as you do these exercises. Stop right away if you feel sudden pain or your pain gets worse. Do not begin these exercises until told by your health care provider. Stretching and range-of-motion exercises Cervical side bending  Using good posture, sit on a stable chair or stand up. Without moving your shoulders, slowly tilt your left / right ear to your shoulder until you feel a stretch in the neck muscles on the opposite side. You should be looking straight ahead. Hold for __________ seconds. Repeat with the other side of your neck. Repeat __________ times. Complete this exercise __________ times a day. Cervical rotation  Using good posture, sit on a stable chair or stand up. Slowly turn your head to the side as if you are looking over your left / right shoulder. Keep your eyes level with the ground. Stop when you feel a stretch along the side and the back of your neck. Hold for __________ seconds. Repeat this by turning to your other side. Repeat __________ times. Complete this exercise __________ times a day. Thoracic extension and pectoral stretch  Roll a towel or a small blanket so it is about 4 inches (10 cm) in diameter. Lie down on your back on a firm surface. Put the towel in the middle of your back across your spine. It should not be under your shoulder blades. Put your hands behind your head and let your elbows fall out to your sides. Hold for __________ seconds. Repeat __________ times. Complete this exercise __________ times a day. Strengthening exercises Upper cervical flexion  Lie on your back with a thin pillow behind your head or a small, rolled-up towel under your neck. Gently tuck your chin toward your chest and nod  your head down to look toward your feet. Do not lift your head off the pillow. Hold for __________ seconds. Release the tension slowly. Relax your neck muscles completely before you repeat this exercise. Repeat __________ times. Complete this exercise __________ times a day. Cervical extension  Stand about 6 inches (15 cm) away from a wall, with your back facing the wall. Place a soft object, about 6-8 inches (15-20 cm) in diameter, between the back of your head and the wall. A soft object could be a small pillow, a ball, or a folded towel. Gently tilt your head back and press into the soft object. Keep your jaw and forehead relaxed. Hold for __________ seconds. Release the tension slowly. Relax your neck muscles completely before you repeat this exercise. Repeat __________ times. Complete this exercise __________ times a day. Posture and body mechanics Body mechanics refer to the movements and positions of your body while you do your daily activities. Posture is part of body mechanics. Good posture and healthy body mechanics can help to relieve stress in your body's tissues and joints. Good posture means that your spine is in its natural S-curve position (your spine is neutral), your shoulders are pulled back slightly, and your head is not tipped forward. The following are general guidelines for using improved posture and body mechanics in your everyday activities. Sitting  When sitting, keep your spine neutral and keep your feet flat on the floor. Use a footrest, if needed, and keep your thighs parallel to the floor. Avoid rounding  your shoulders. Avoid tilting your head forward. When working at a desk or a computer, keep your desk at a height where your hands are slightly lower than your elbows. Slide your chair under your desk so you are close enough to maintain good posture. When working at a computer, place your monitor at a height where you are looking straight ahead and you do not have to  tilt your head forward or downward to look at the screen. Standing  When standing, keep your spine neutral and keep your feet about hip-width apart. Keep a slight bend in your knees. Your ears, shoulders, and hips should line up. When you do a task in which you stand in one place for a long time, place one foot up on a stable object that is 2-4 inches (5-10 cm) high, such as a footstool. This helps keep your spine neutral. Resting When lying down and resting, avoid positions that are most painful for you. Try to support your neck in a neutral position. You can use a contour pillow or a small rolled-up towel. Your pillow should support your neck but not push on it. This information is not intended to replace advice given to you by your health care provider. Make sure you discuss any questions you have with your health care provider. Document Revised: 09/11/2022 Document Reviewed: 11/28/2021 Elsevier Patient Education  2024 [employer].

## 2024-06-10 NOTE — Progress Notes (Signed)
 "  Complete physical exam  Patient: Dawn Dunlap   DOB: 11/08/1955   69 y.o. Female  MRN: [id number]  Subjective:    Chief Complaint  Patient presents with   Annual Exam   .Discussed the use of AI scribe software for clinical note transcription with the patient, who gave verbal consent to proceed.  History of Present Illness Dawn Dunlap is a 69 year old female who presents to the clinic for CPE. She has concerns of  neck arthritis who presents with neck pain.  Neck pain and cervical arthropathy - Persistent neck pain throughout the day - Pain exacerbated by carrying heavy trays at work - History of neck arthritis since her twenties - Previous episode of sharp, stabbing neck pain lasting about a month, now resolved - Tenderness in specific areas, suspecting muscle involvement - Occasional use of Advil for severe pain - Gabapentin provides some relief for neck pain - No current use of AMX except rarely, taking only a third of a dose when needed  Paresthesia of upper extremities - Occasional tingling in the upper extremities - Tingling associated with carrying heavy trays and possibly carpal tunnel syndrome - Tingling has improved but still occurs occasionally  Urticaria - Currently under care of a dermatologist - Prescribed three different antihistamines and gabapentin - Significant improvement in urticaria symptoms with current regimen  Physical activity and occupational demands - Work involves significant physical activity, including walking approximately eighteen thousand steps a day when in [place] - Job on a train requires constant movement, 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 0  Number falls in past yr: 0  Injury with Fall? 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   Anxiety 04/23/2023   Tricompartment osteoarthritis of right knee 02/13/2023   Grade I MCL Sprain of Right Knee 02/13/2023   Tear of medial meniscus of right knee 02/13/2023   Tear of lateral meniscus of right knee 02/13/2023   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 02/13/2023   Chronic pain of left knee 01/10/2023   Primary osteoarthritis of right knee 01/10/2023   Gallstones 07/12/2022   Elevated blood pressure reading 07/07/2022   Epigastric pain 07/05/2022   Right upper quadrant pain 07/05/2022   Hiatal hernia 07/05/2022   Mass of upper inner quadrant of left breast 04/11/2022  Hyperplastic polyp of sigmoid colon 10/10/2016   GERD (gastroesophageal reflux disease) 06/20/2016   Hyperlipidemia 06/20/2016   Irritable bowel syndrome with diarrhea 06/20/2016   Past Medical History:  Diagnosis Date   GERD (gastroesophageal reflux disease)    Hyperlipidemia    Past Surgical History:  Procedure Laterality Date   COLONOSCOPY  2012   LASER LAPAROSCOPY     X2   PELVIC LAPAROSCOPY  1991/1992   SKIN CANCER DESTRUCTION  2008   NECK   TONSILLECTOMY     Family History  Problem Relation Age of  Onset   Dementia Mother    Allergies[1]    Patient Care Team: Cythia Bachtel L, PA-C as PCP - General (Family Medicine)   Show/hide medication list[2]  ROS See HPI.     Objective:     BP (!) 146/80   Pulse (!) 110   Wt 170 lb 12 oz (77.5 kg)   SpO2 96%   BMI 34.49 kg/m  BP Readings from Last 3 Encounters:  06/10/24 (!) 146/80  06/04/23 134/84  04/23/23 (!) 138/92   Wt Readings from Last 3 Encounters:  06/10/24 170 lb 12 oz (77.5 kg)  06/04/23 161 lb (73 kg)  04/23/23 156 lb (70.8 kg)      Physical Exam Constitutional:      Appearance: Normal appearance. She is obese.  HENT:     Head: Normocephalic.     Right Ear: Tympanic membrane, ear canal and external ear normal. There is no impacted cerumen.     Left Ear: Tympanic membrane, ear canal and external ear normal. There is no impacted cerumen.     Nose: Nose normal.     Mouth/Throat:     Mouth: Mucous membranes are moist.     Pharynx: No oropharyngeal exudate or posterior oropharyngeal erythema.  Eyes:     Extraocular Movements: Extraocular movements intact.     Conjunctiva/sclera: Conjunctivae normal.     Pupils: Pupils are equal, round, and reactive to light.  Cardiovascular:     Rate and Rhythm: Normal rate and regular rhythm.     Pulses: Normal pulses.     Heart sounds: Normal heart sounds.  Pulmonary:     Effort: Pulmonary effort is normal.  Abdominal:     General: Bowel sounds are normal. There is no distension.     Palpations: Abdomen is soft. There is no mass.     Tenderness: There is no abdominal tenderness. There is no right CVA tenderness or left CVA tenderness.     Hernia: No hernia is present.  Musculoskeletal:     Cervical back: Normal range of motion and neck supple. No tenderness.     Right lower leg: No edema.     Left lower leg: No edema.     Comments: No pain to palpation over cervical spine.  Negative tinels and phalens bilateral wrists.  Strength 5/5 upper extremities.  NROM of  bilateral shoulders.   Lymphadenopathy:     Cervical: No cervical adenopathy.  Skin:    General: Skin is warm.     Findings: No rash.  Neurological:     General: No focal deficit present.     Mental Status: She is alert and oriented to person, place, and time.  Psychiatric:        Mood and Affect: Mood normal.         Assessment & Plan:    Routine Health Maintenance and Physical Exam  Immunization History  Administered Date(s) Administered  Influenza Split 01/23/2012   Influenza, Seasonal, Injecte, Preservative Fre 02/17/2014, 04/19/2015   Influenza,inj,Quad PF,6+ Mos 02/23/2018   Influenza-Unspecified 03/05/2013, 02/17/2014, 04/19/2015, 04/05/2016, 03/22/2017, 02/19/2019, 04/21/2020, 03/28/2022, 03/23/2023, 02/11/2024   PFIZER(Purple Top)SARS-COV-2 Vaccination 08/06/2019, 08/27/2019, 04/21/2020   Pneumococcal Conjugate-13 04/19/2015   Pneumococcal Polysaccharide-23 02/19/2014   Tdap 08/24/2014    Health Maintenance  Topic Date Due   Medicare Annual Wellness (AWV)  Never done   Colonoscopy  07/11/2021   Bone Density Scan  Never done   COVID-19 Vaccine (4 - 2025-26 season) 06/26/2024 (Originally 01/21/2024)   Zoster Vaccines- Shingrix (1 of 2) 09/08/2024 (Originally 09/11/1975)   Pneumococcal Vaccine: 50+ Years (3 of 3 - PCV20 or PCV21) 06/10/2025 (Originally 04/18/2020)   Hepatitis C Screening  06/10/2025 (Originally 09[birth year]11/1974)   DTaP/Tdap/Td (2 - Td or Tdap) 08/23/2024   Mammogram  04/24/2025   Influenza Vaccine  Completed   Meningococcal B Vaccine  Aged Out    Discussed health benefits of physical activity, and encouraged her to engage in regular exercise appropriate for her age and condition. Marton was seen today for annual exam.  Diagnoses and all orders for this visit:  Annual physical exam -     CBC with Differential/Platelet -     CMP14+EGFR -     Lipid panel -     TSH -     VITAMIN D  25 Hydroxy (Vit-D Deficiency, Fractures) -     Hepatitis C Antibody -      B12 and Folate Panel -     DG Cervical Spine Complete; Future -     DG Bone Density; Future -     MM 3D SCREENING MAMMOGRAM BILATERAL BREAST  Mixed hyperlipidemia -     Lipid panel  Chronic neck pain -     DG Cervical Spine Complete; Future -     meloxicam  (MOBIC ) 15 MG tablet; Take 1 tablet (15 mg total) by mouth daily.  Osteoporosis screening -     DG Bone Density; Future  Visit for screening mammogram -     MM 3D SCREENING MAMMOGRAM BILATERAL BREAST  Encounter for hepatitis C screening test for low risk patient -     Hepatitis C Antibody  Screening for diabetes mellitus -     CMP14+EGFR  Hair loss  Urticaria   Assessment & Plan Cervical spondylosis with chronic neck pain Chronic neck pain likely due to cervical spondylosis, exacerbated by carrying heavy trays and poor posture. Gabapentin provides some relief. Discussed cervical pillow, massages, TENS units, and potential x-ray. - Ordered cervical spine x-ray to assess for degenerative changes. - Recommended cervical pillow for neck support during sleep. - Suggested therapeutic massages and TENS units for muscle relaxation. - Consider meloxicam  for arthritis pain management. - Discussed potential specialist referral for injections if severe degeneration is noted.  Chronic urticaria Managed with antihistamines and gabapentin, resulting in significant improvement. Dermatologist advised against long-term use of current regimen. - Continue current regimen of antihistamines and gabapentin as prescribed by dermatologist.  General Health Maintenance Routine health maintenance discussed, including screenings and vaccinations. Per patient will wait to have colonoscopy in [place] where she has had the rest of her colonoscopies. She prefers local bone density and mammogram. Declined vaccines. - Scheduled bone density and mammogram appointments locally. - Provided referral for fasting labs to be completed Monday through  Friday.    Return in about 1 year (around 06/10/2025).     Lucretia Pendley, PA-C      [1]  Allergies Allergen  Reactions   Kiwi Extract Itching    Throats itches  [2]  Outpatient Medications Prior to Visit  Medication Sig   albuterol  (VENTOLIN  HFA) 108 (90 Base) MCG/ACT inhaler Inhale 2 puffs into the lungs every 6 (six) hours as needed.   ALPRAZolam  (XANAX ) 0.5 MG tablet Take 0.5-1 tablets (0.25-0.5 mg total) by mouth at bedtime as needed for anxiety (Use sparingly to prevent tolerance/dependence).   buPROPion  (WELLBUTRIN  XL) 150 MG 24 hr tablet TAKE 1 TABLET BY MOUTH DAILY   clobetasol  cream (TEMOVATE ) 0.05 % Apply 1 Application topically 2 (two) times daily.   gabapentin (NEURONTIN) 100 MG capsule Take 100 mg by mouth 3 (three) times daily.   glucosamine-chondroitin 500-400 MG tablet Take 1 tablet by mouth 2 (two) times daily.   hydrOXYzine  (ATARAX ) 25 MG tablet Take 25 mg by mouth daily.   minoxidil (LONITEN) 2.5 MG tablet Take by mouth.   omeprazole  (PRILOSEC) 40 MG capsule TAKE 1 CAPSULE BY MOUTH DAILY   ondansetron  (ZOFRAN -ODT) 8 MG disintegrating tablet Take 1 tablet (8 mg total) by mouth every 8 (eight) hours as needed.   [DISCONTINUED] simvastatin  (ZOCOR ) 40 MG tablet Take 0.5 tablets (20 mg total) by mouth daily.   [DISCONTINUED] azithromycin  (ZITHROMAX  Z-PAK) 250 MG tablet Take 2 tablets (500 mg) on  Day 1,  followed by 1 tablet (250 mg) once daily on Days 2 through 5.   [DISCONTINUED] methylPREDNISolone  (MEDROL  DOSEPAK) 4 MG TBPK tablet Take as directed by package insert.   No facility-administered medications prior to visit.   "

## 2024-06-17 ENCOUNTER — Encounter: Payer: Self-pay | Admitting: Physician Assistant

## 2024-06-26 ENCOUNTER — Telehealth: Payer: Self-pay

## 2024-06-26 DIAGNOSIS — R1013 Epigastric pain: Secondary | ICD-10-CM

## 2024-06-26 DIAGNOSIS — K219 Gastro-esophageal reflux disease without esophagitis: Secondary | ICD-10-CM

## 2024-06-26 DIAGNOSIS — R1011 Right upper quadrant pain: Secondary | ICD-10-CM

## 2024-06-26 DIAGNOSIS — K449 Diaphragmatic hernia without obstruction or gangrene: Secondary | ICD-10-CM

## 2024-06-26 MED ORDER — OMEPRAZOLE 40 MG PO CPDR: 40.0000 mg | DELAYED_RELEASE_CAPSULE | Freq: Every day | ORAL | 1 refills | Status: AC

## 2024-06-26 NOTE — Telephone Encounter (Signed)
 Copied from CRM [id number]. Topic: Clinical - Medication Refill >> Jun 26, 2024  4:03 PM Yolanda T wrote: Patient said she can't eat without this medication and took her last dosage last night  Medication:  omeprazole  (PRILOSEC) 40 MG capsule    Has the patient contacted their pharmacy? Yes Pharmacy said they sent the request several times with no response  This is the patient's preferred pharmacy:   [facility] [id number] - [place], [place] - [address] [place] [address] Phone: [phone] Fax: [phone]  Is this the correct pharmacy for this prescription? Yes  Has the prescription been filled recently? Yes  Is the patient out of the medication? Yes  Has the patient been seen for an appointment in the last year OR does the patient have an upcoming appointment? Yes  Can we respond through MyChart? Yes  Agent: Please be advised that Rx refills may take up to 3 business days. We ask that you follow-up with your pharmacy.

## 2024-06-26 NOTE — Telephone Encounter (Signed)
 Requesting rx rf of Omeprazole  40mg   Last written 08/29/2023 Last OV 06/10/2024 Upcoming appt

## 2024-07-03 ENCOUNTER — Ambulatory Visit (HOSPITAL_BASED_OUTPATIENT_CLINIC_OR_DEPARTMENT_OTHER)

## 2024-07-03 ENCOUNTER — Other Ambulatory Visit (HOSPITAL_BASED_OUTPATIENT_CLINIC_OR_DEPARTMENT_OTHER)
